# Patient Record
Sex: Female | Born: 1960 | Race: White | Hispanic: No | Marital: Married | Smoking: Former smoker
Health system: Southern US, Community
[De-identification: ages and names within clinical notes are randomized; demographics above are authoritative.]

## PROBLEM LIST (undated history)

## (undated) HISTORY — PX: TUBAL LIGATION: SHX77

---

## 1982-07-08 HISTORY — PX: OTHER SURGICAL HISTORY: SHX169

## 1999-03-15 ENCOUNTER — Encounter (INDEPENDENT_AMBULATORY_CARE_PROVIDER_SITE_OTHER): Payer: Self-pay | Admitting: Specialist

## 1999-03-15 ENCOUNTER — Other Ambulatory Visit: Admission: RE | Admit: 1999-03-15 | Discharge: 1999-03-15 | Payer: Self-pay | Admitting: Otolaryngology

## 2002-06-23 ENCOUNTER — Ambulatory Visit (HOSPITAL_COMMUNITY): Admission: RE | Admit: 2002-06-23 | Discharge: 2002-06-23 | Payer: Self-pay | Admitting: Family Medicine

## 2002-06-23 ENCOUNTER — Encounter: Payer: Self-pay | Admitting: Family Medicine

## 2002-07-05 ENCOUNTER — Ambulatory Visit (HOSPITAL_COMMUNITY): Admission: RE | Admit: 2002-07-05 | Discharge: 2002-07-05 | Payer: Self-pay | Admitting: Family Medicine

## 2002-07-05 ENCOUNTER — Encounter: Payer: Self-pay | Admitting: Family Medicine

## 2002-11-05 ENCOUNTER — Ambulatory Visit (HOSPITAL_BASED_OUTPATIENT_CLINIC_OR_DEPARTMENT_OTHER): Admission: RE | Admit: 2002-11-05 | Discharge: 2002-11-05 | Payer: Self-pay | Admitting: Otolaryngology

## 2002-11-05 ENCOUNTER — Encounter (INDEPENDENT_AMBULATORY_CARE_PROVIDER_SITE_OTHER): Payer: Self-pay | Admitting: Otolaryngology

## 2003-07-07 ENCOUNTER — Ambulatory Visit (HOSPITAL_COMMUNITY): Admission: RE | Admit: 2003-07-07 | Discharge: 2003-07-07 | Payer: Self-pay | Admitting: Family Medicine

## 2003-07-14 ENCOUNTER — Ambulatory Visit (HOSPITAL_COMMUNITY): Admission: RE | Admit: 2003-07-14 | Discharge: 2003-07-14 | Payer: Self-pay | Admitting: Family Medicine

## 2004-08-22 ENCOUNTER — Ambulatory Visit (HOSPITAL_COMMUNITY): Admission: RE | Admit: 2004-08-22 | Discharge: 2004-08-22 | Payer: Self-pay | Admitting: Family Medicine

## 2004-11-25 IMAGING — US US PELVIS COMPLETE MODIFY
1 series · 14 of 18 positions shown · non-contrast
Comparison: none

CLINICAL DATA: Pelvic pain, right lower quadrant pain.  
 US TRANSABDOMINAL/TRANSVAGINAL NON-OB PELVIC
 The uterus is retroverted.  There are no focal uterine abnormalities and the uterus measures 10.7 x 5.0 x 6.3 cm in size.  The endometrial stripe thickness measures 14 mm.  The ovaries are normal in size and contour and contain multiple simple appearing follicular cysts, with the largest cyst on the right measuring 1.4 cm in diameter and the largest cyst on the left measuring 1.7 cm in diameter.  There is a very small amount of free pelvic fluid.
 IMPRESSION
 1.  Retroverted uterus.
 2.  Multiple small most likely physiologic ovarian cysts noted bilaterally as discussed above.

[Series 1: unknown · 0.40mm/px · 14 of 18 slices shown]
[im 1/18]
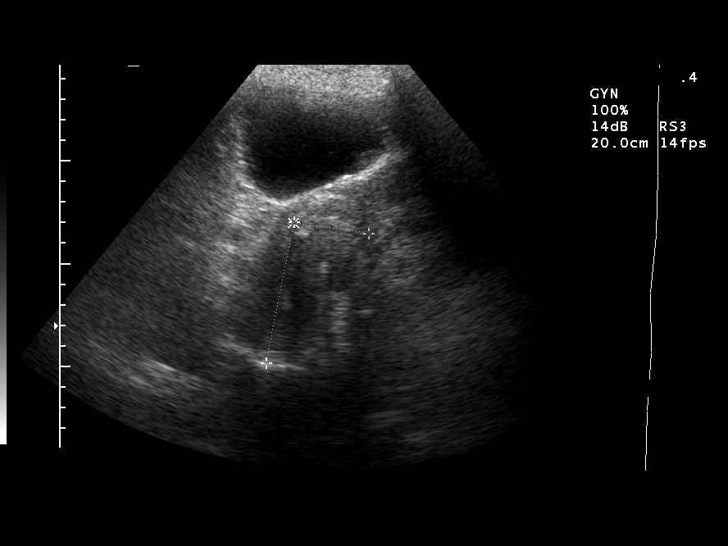
[im 2/18]
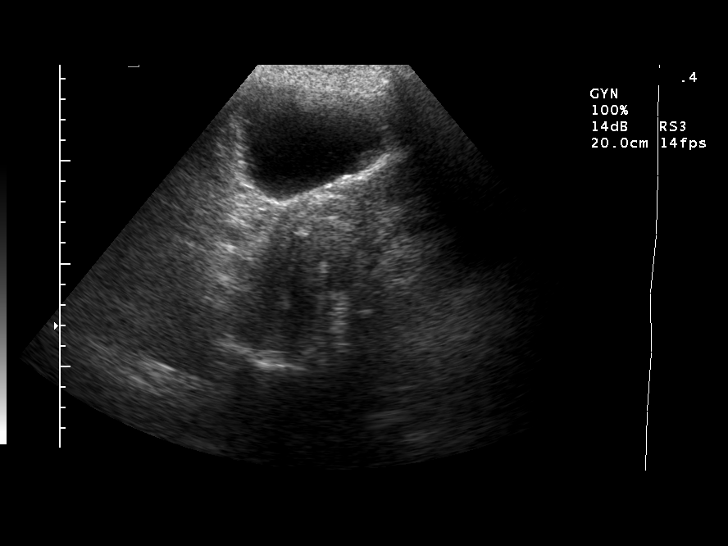
[im 4/18]
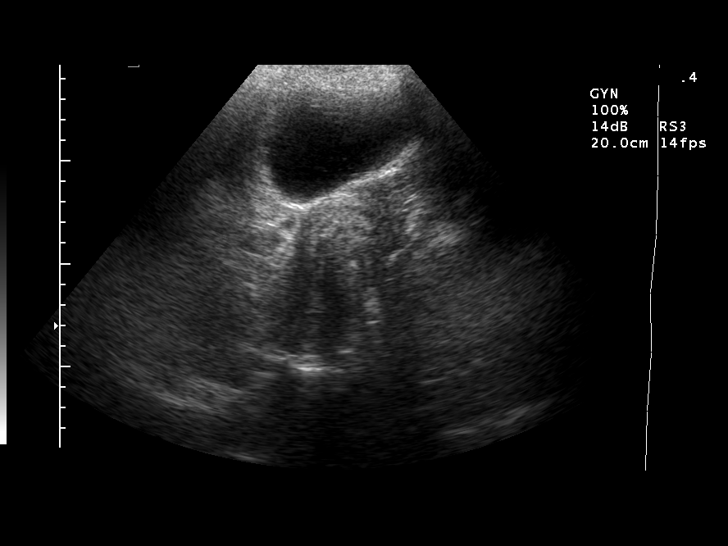
[im 5/18]
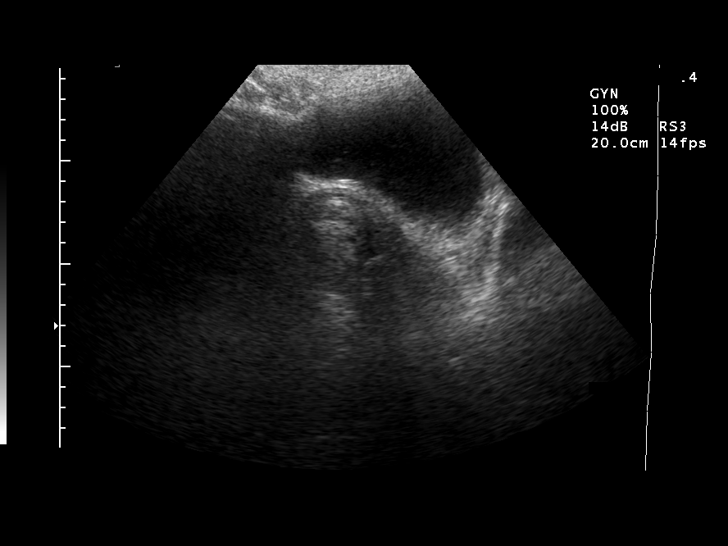
[im 6/18]
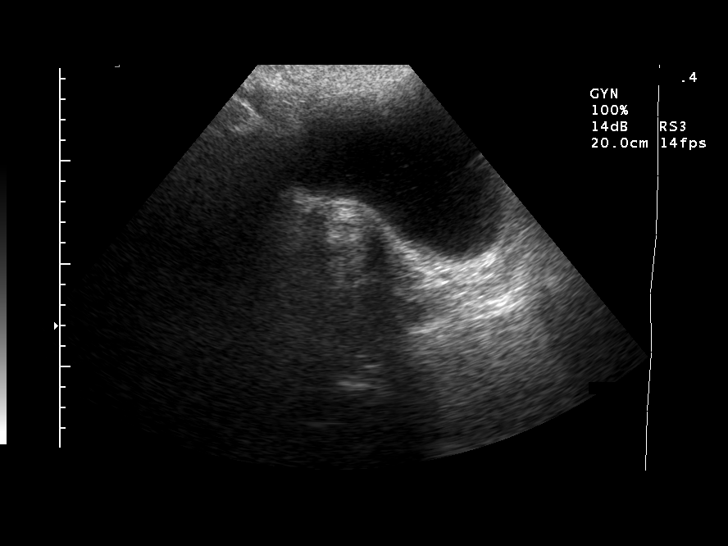
[im 8/18]
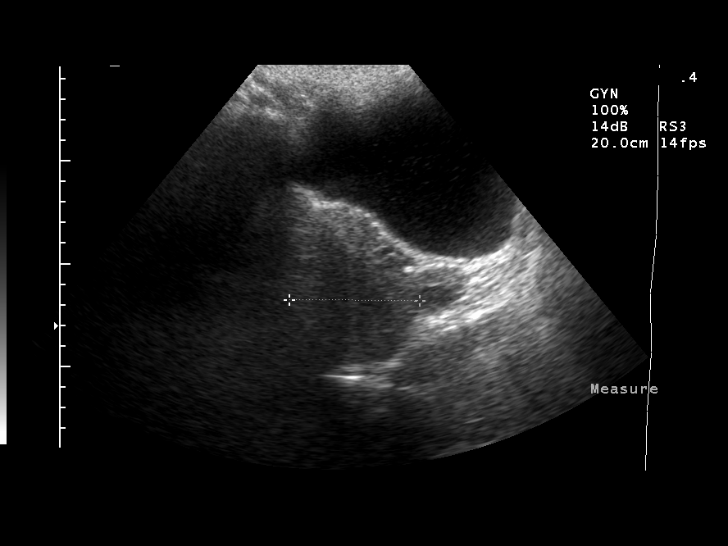
[im 9/18]
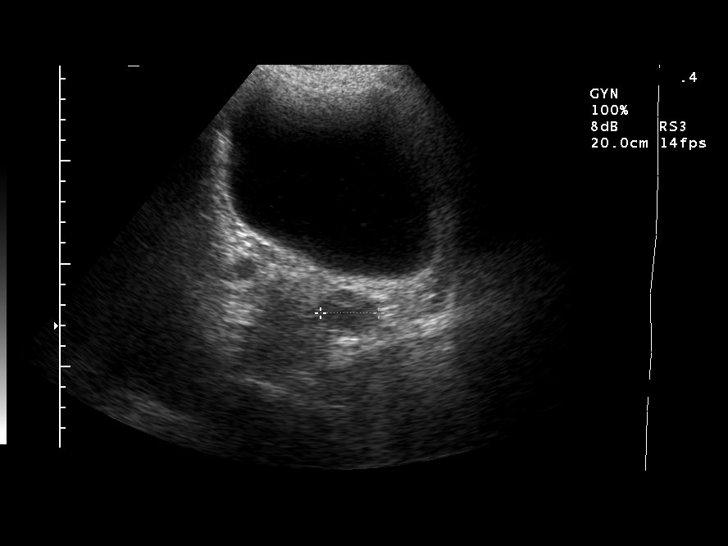
[im 10/18]
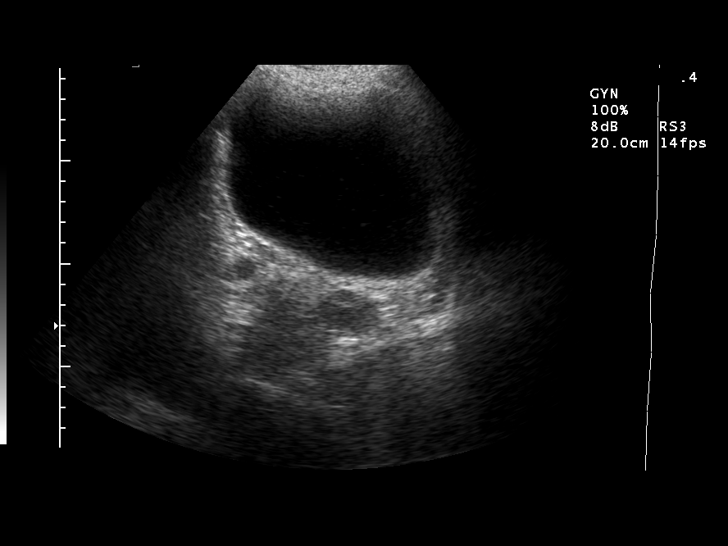
[im 11/18]
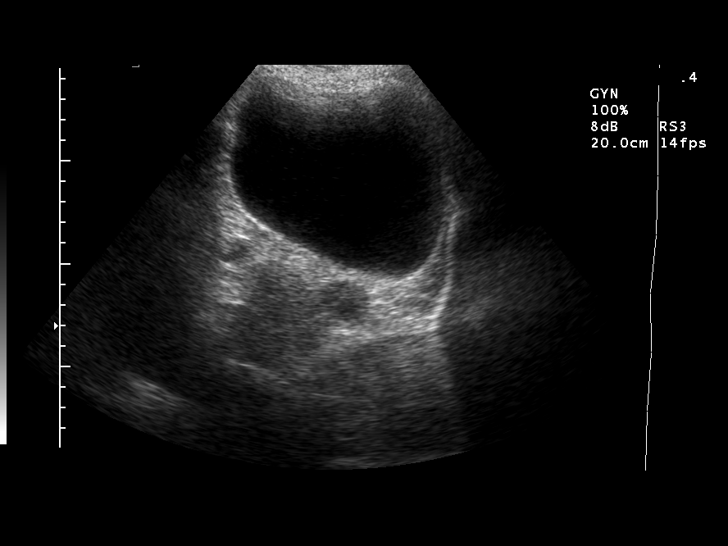
[im 13/18]
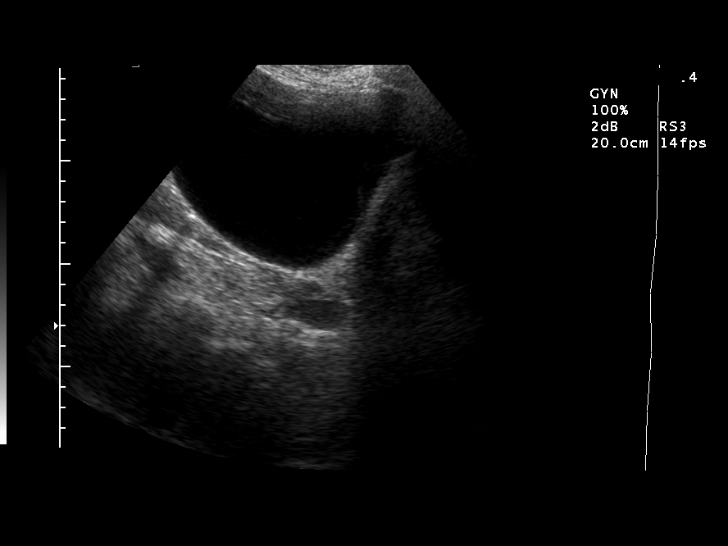
[im 14/18]
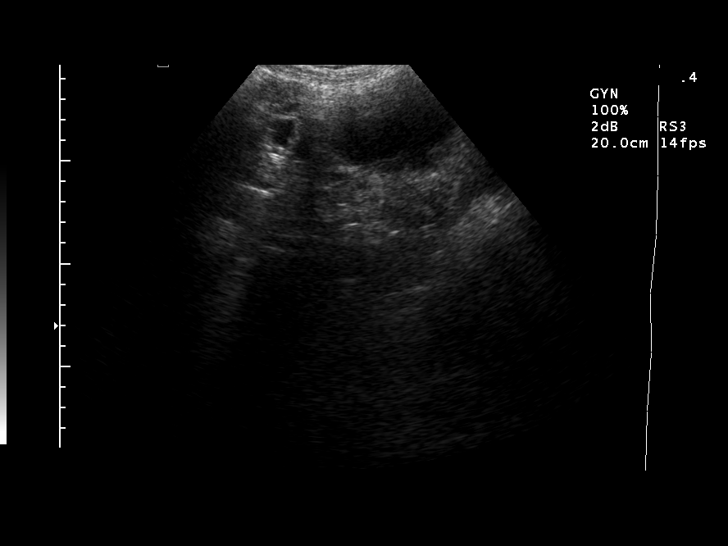
[im 15/18]
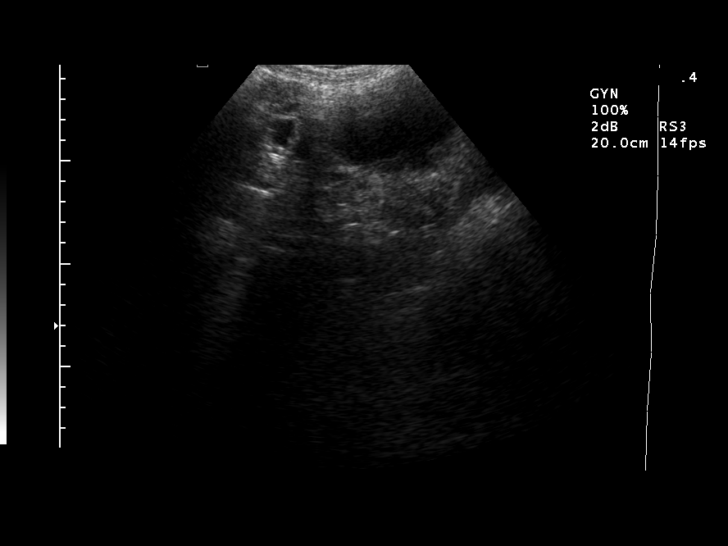
[im 17/18]
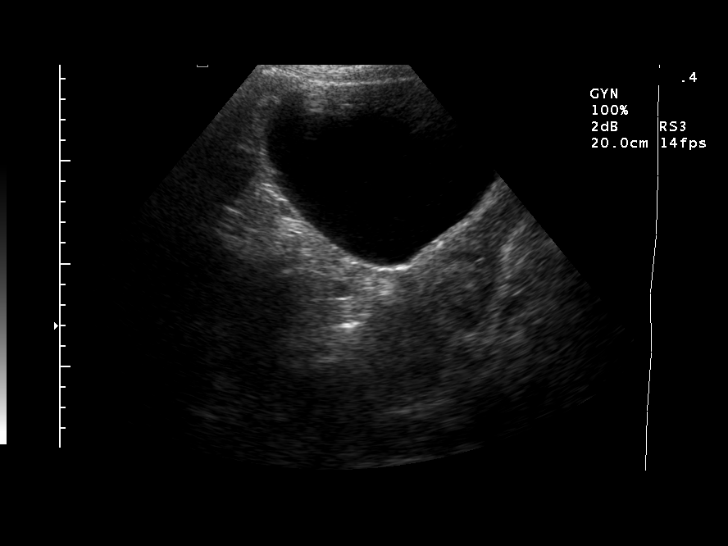
[im 18/18]
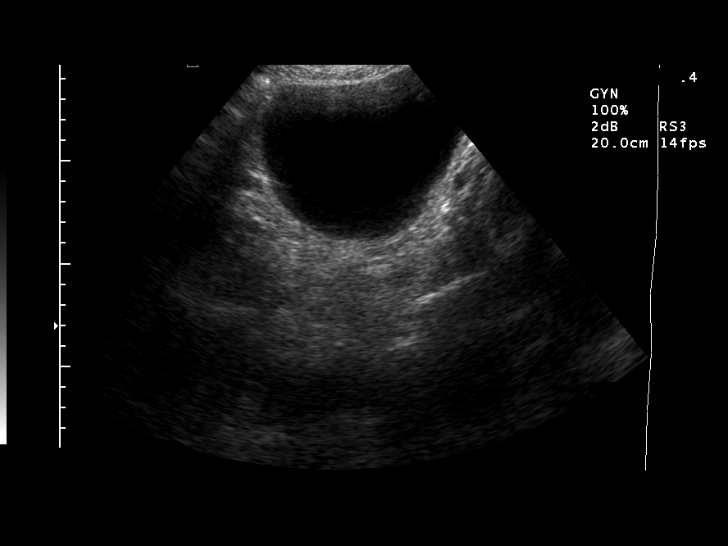

[14 of 18 positions shown; findings below may reference images not displayed]

## 2004-11-25 IMAGING — US US TRANSVAGINAL NON-OB
1 series · 14 of 25 positions shown · non-contrast
Comparison: none

CLINICAL DATA: Pelvic pain, right lower quadrant pain.  
 US TRANSABDOMINAL/TRANSVAGINAL NON-OB PELVIC
 The uterus is retroverted.  There are no focal uterine abnormalities and the uterus measures 10.7 x 5.0 x 6.3 cm in size.  The endometrial stripe thickness measures 14 mm.  The ovaries are normal in size and contour and contain multiple simple appearing follicular cysts, with the largest cyst on the right measuring 1.4 cm in diameter and the largest cyst on the left measuring 1.7 cm in diameter.  There is a very small amount of free pelvic fluid.
 IMPRESSION
 1.  Retroverted uterus.
 2.  Multiple small most likely physiologic ovarian cysts noted bilaterally as discussed above.

[Series 1: unknown · 0.15mm/px · 14 of 39 slices shown]
[im 1/39]
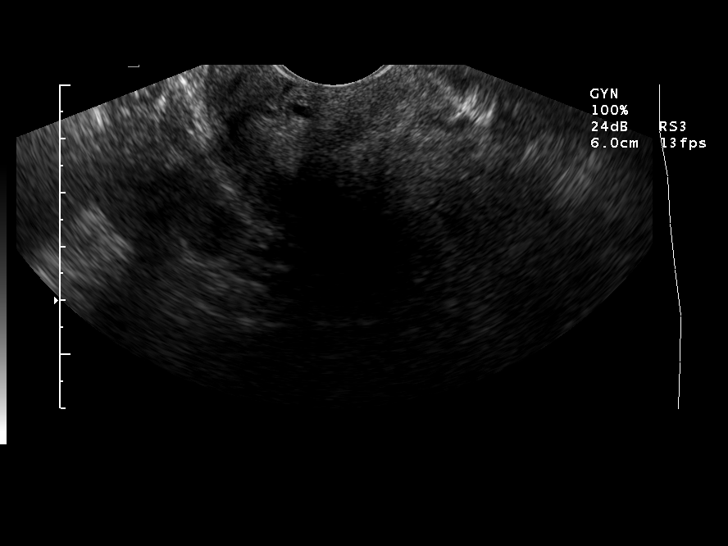
[im 4/39]
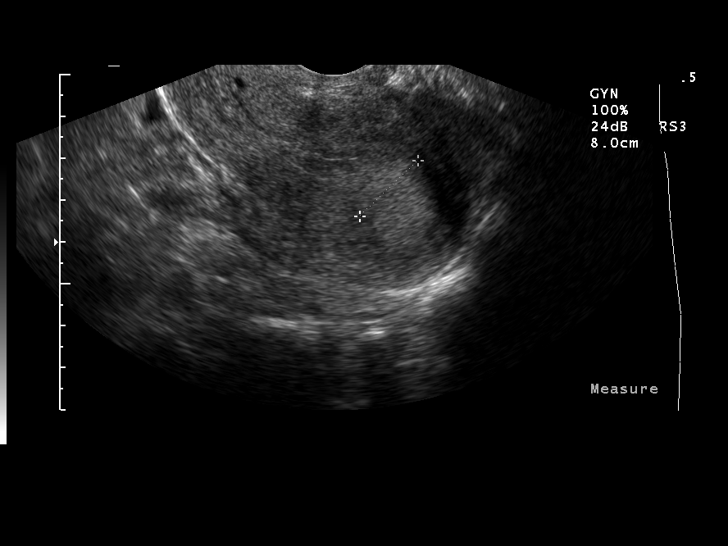
[im 7/39]
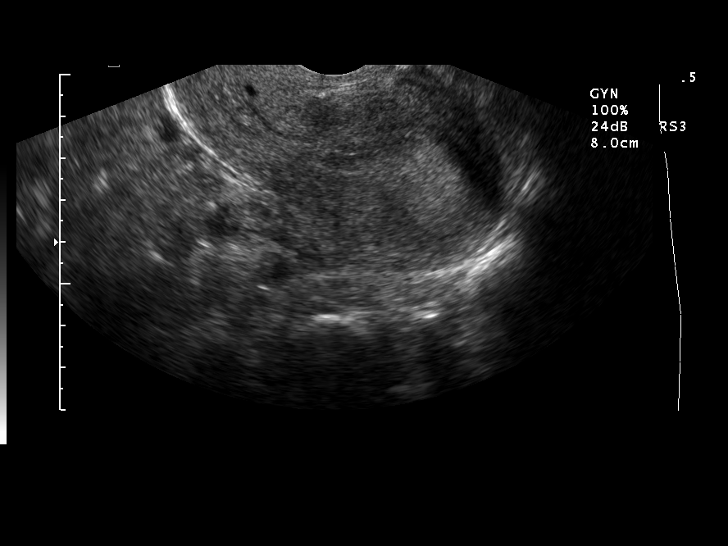
[im 10/39]
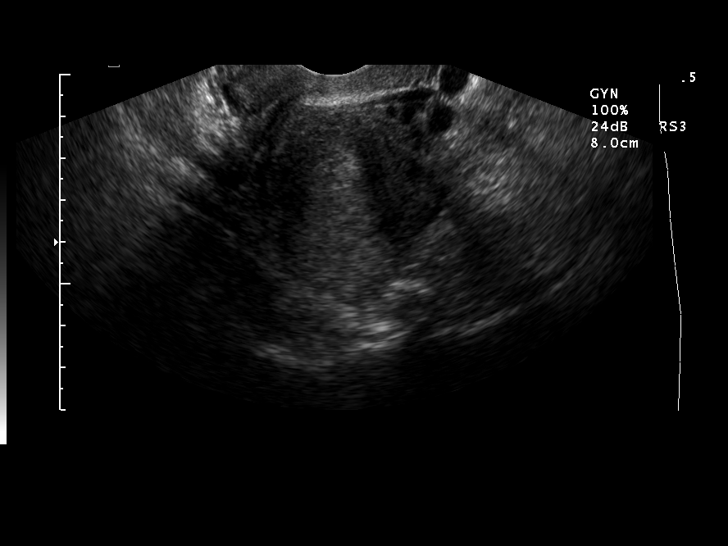
[im 13/39]
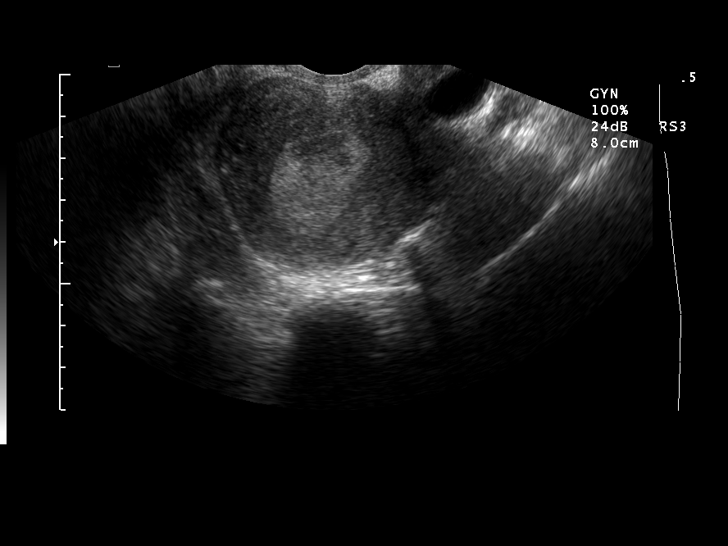
[im 15/39]
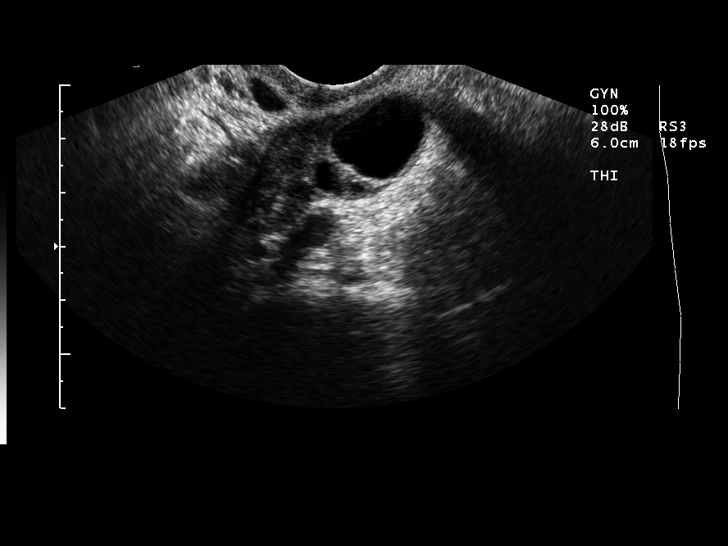
[im 18/39]
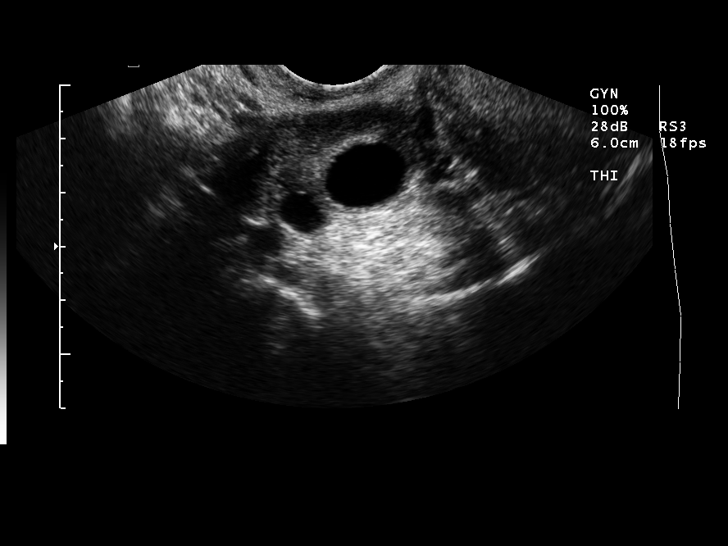
[im 21/39]
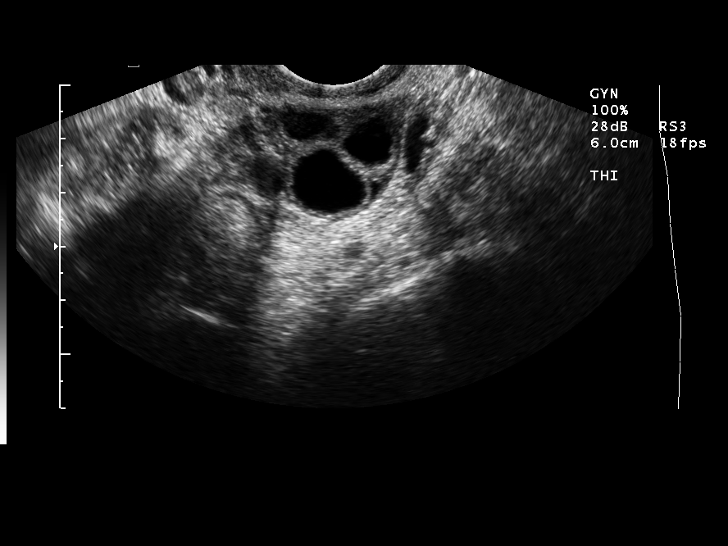
[im 24/39]
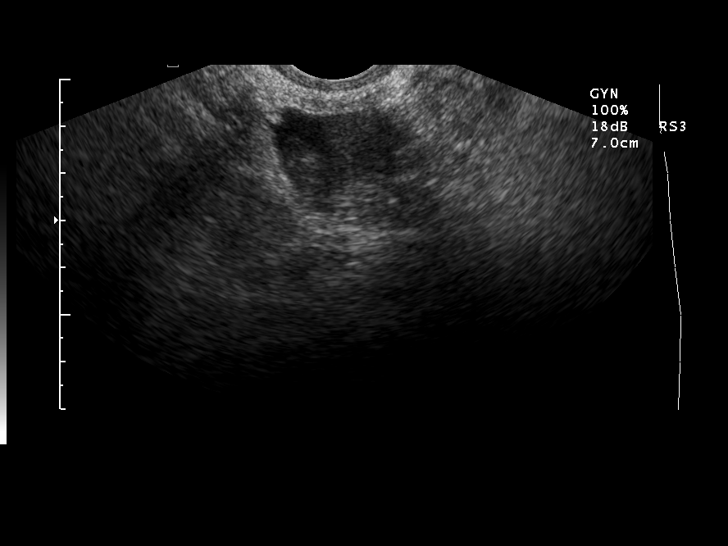
[im 26/39]
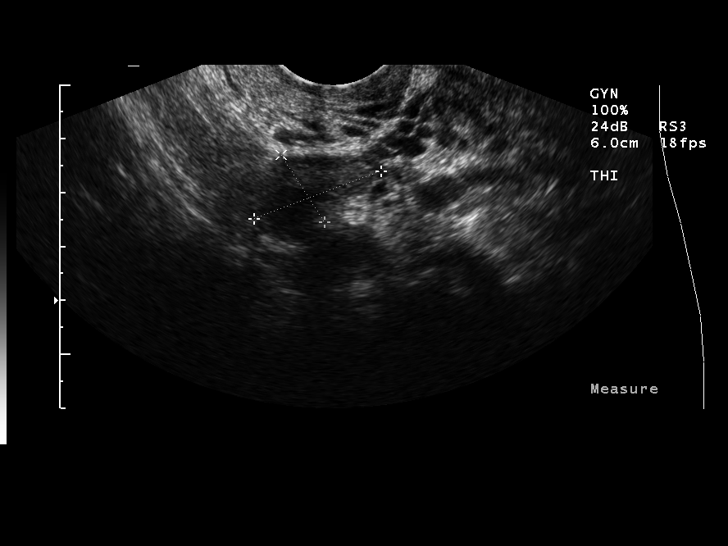
[im 29/39]
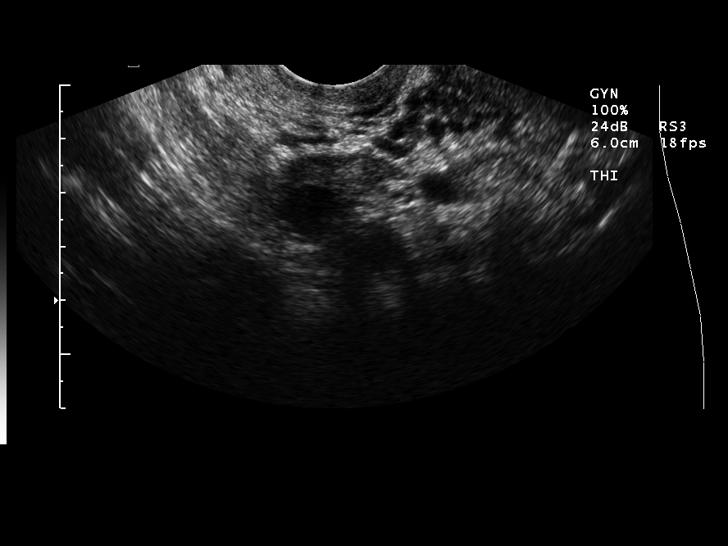
[im 32/39]
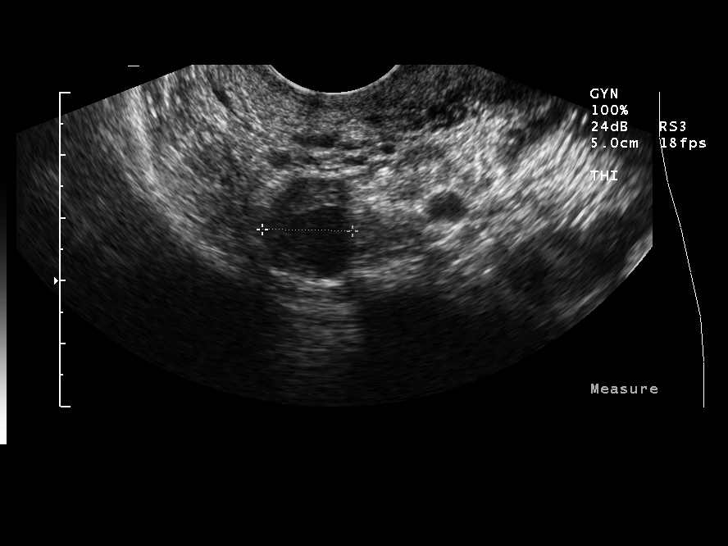
[im 35/39]
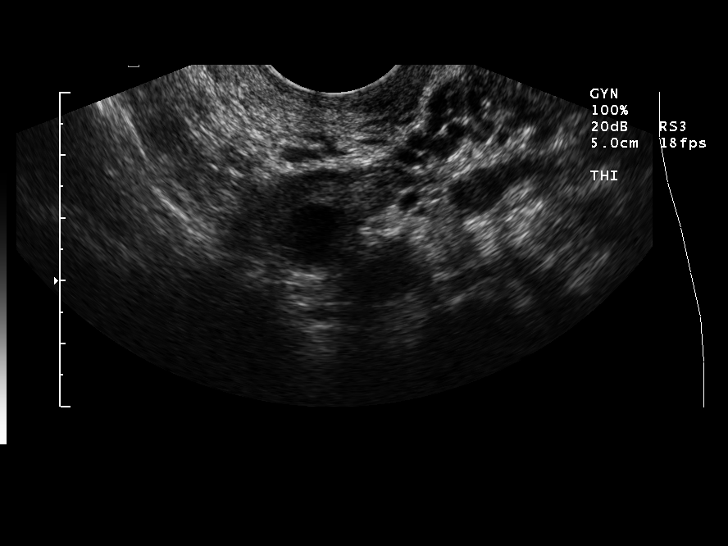
[im 39/39]
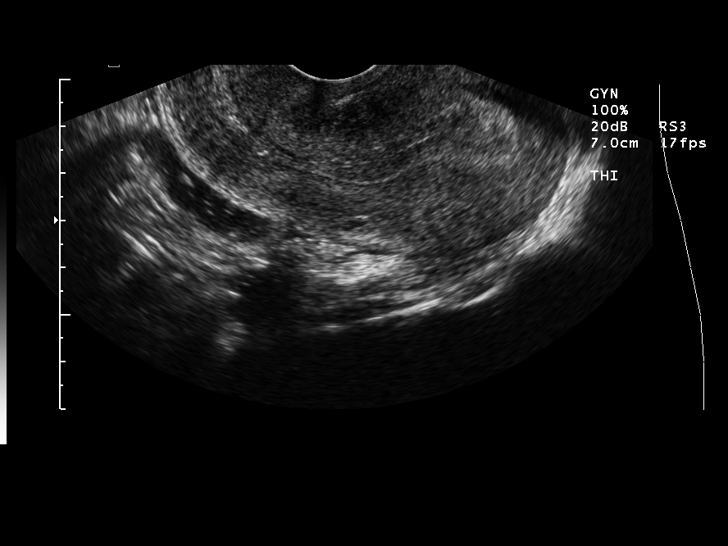

[14 of 25 positions shown; findings below may reference images not displayed]

## 2005-08-26 ENCOUNTER — Ambulatory Visit (HOSPITAL_COMMUNITY): Admission: RE | Admit: 2005-08-26 | Discharge: 2005-08-26 | Payer: Self-pay | Admitting: Family Medicine

## 2006-08-28 ENCOUNTER — Ambulatory Visit (HOSPITAL_COMMUNITY): Admission: RE | Admit: 2006-08-28 | Discharge: 2006-08-28 | Payer: Self-pay | Admitting: Family Medicine

## 2007-08-16 ENCOUNTER — Emergency Department (HOSPITAL_COMMUNITY): Admission: EM | Admit: 2007-08-16 | Discharge: 2007-08-16 | Payer: Self-pay | Admitting: Family Medicine

## 2007-09-22 ENCOUNTER — Ambulatory Visit (HOSPITAL_COMMUNITY): Admission: RE | Admit: 2007-09-22 | Discharge: 2007-09-22 | Payer: Self-pay | Admitting: Family Medicine

## 2008-09-23 ENCOUNTER — Ambulatory Visit (HOSPITAL_COMMUNITY): Admission: RE | Admit: 2008-09-23 | Discharge: 2008-09-23 | Payer: Self-pay | Admitting: Family Medicine

## 2009-10-05 ENCOUNTER — Ambulatory Visit (HOSPITAL_COMMUNITY): Admission: RE | Admit: 2009-10-05 | Discharge: 2009-10-05 | Payer: Self-pay | Admitting: Family Medicine

## 2009-11-14 ENCOUNTER — Encounter (INDEPENDENT_AMBULATORY_CARE_PROVIDER_SITE_OTHER): Payer: Self-pay | Admitting: *Deleted

## 2009-11-23 ENCOUNTER — Encounter (INDEPENDENT_AMBULATORY_CARE_PROVIDER_SITE_OTHER): Payer: Self-pay | Admitting: *Deleted

## 2009-11-27 ENCOUNTER — Ambulatory Visit: Payer: Self-pay | Admitting: Gastroenterology

## 2009-12-11 ENCOUNTER — Ambulatory Visit: Payer: Self-pay | Admitting: Gastroenterology

## 2010-08-07 NOTE — Miscellaneous (Signed)
Summary: [facility] Previsit/prep  Clinical Lists Changes  Medications: Added new medication of MOVIPREP 100 GM  SOLR (PEG-KCL-NACL-NASULF-NA ASC-C) As per prep instructions. - Signed Rx of MOVIPREP 100 GM  SOLR (PEG-KCL-NACL-NASULF-NA ASC-C) As per prep instructions.;  #1 x 0;  Signed;  Entered by: Wyona Almas RN;  Authorized by: Mardella Layman MD [facility];  Method used: Electronically to [facility]*, [address] [place], [place], [place]  [address], Ph: [phone] or [phone], Fax: [phone] Observations: Added new observation of NKA: T (11/27/2009 13:03)    Prescriptions: MOVIPREP 100 GM  SOLR (PEG-KCL-NACL-NASULF-NA ASC-C) As per prep instructions.  #1 x 0   Entered by:   Wyona Almas RN   Authorized by:   Mardella Layman MD [facility]   Signed by:   Wyona Almas RN on 11/27/2009   Method used:   Electronically to        [facility]* (retail)       [address]       [place], [place]  [address]       Ph: [phone] or [phone]       Fax: [phone]   RxID:   [id number]

## 2010-08-07 NOTE — Procedures (Signed)
Summary: Colonoscopy  Patient: Jacqueline Vargas Note: All result statuses are Final unless otherwise noted.  Tests: (1) Colonoscopy (COL)   COL Colonoscopy           DONE     [facility]     [address]     [place], [place]  [address]           COLONOSCOPY PROCEDURE REPORT           PATIENT:  [name], [name]  MR#:  [id number]     BIRTHDATE:  April 12, 1961, 48 yrs. old  GENDER:  female     ENDOSCOPIST:  Vania Rea. Jarold Motto, MD, [facility]     REF. BY:  Rudi Heap, M.D.     PROCEDURE DATE:  12/11/2009     PROCEDURE:  Surveillance Colonoscopy     ASA CLASS:  Class I     INDICATIONS:  colorectal cancer screening, average risk     MEDICATIONS:   Fentanyl 100 mcg IV, Versed 6 mg PO           DESCRIPTION OF PROCEDURE:   After the risks benefits and     alternatives of the procedure were thoroughly explained, informed     consent was obtained.  Digital rectal exam was performed and     revealed no abnormalities.   The LB CF-[id number] endoscope     was introduced through the anus and advanced to the cecum, which     was identified by both the appendix and ileocecal valve, limited     by a redundant colon.    The quality of the prep was excellent,     using MoviPrep.  The instrument was then slowly withdrawn as the     colon was fully examined.           FINDINGS:  No polyps or cancers were seen.  incomplete exam. VERY     REDUNDANT LEFT COLON.COULD NOT PASS SPLENIC FLEXURE AREA.     Retroflexed views in the rectum revealed not done.    The scope was     then withdrawn from the patient and the procedure completed.     COMPLICATIONS:  None     ENDOSCOPIC IMPRESSION:     1) No polyps or cancers     2) Incomplete exam     RECOMMENDATIONS:     REPEAT EXAM 5 Y WITH PROPOFOL SEDATION.     REPEAT EXAM:  No           ______________________________     Vania Rea. Jarold Motto, MD, [facility]           CC:           n.     eSIGNED:   Vania Rea. Alucard Fearnow at 12/11/2009 12:10 PM        [name], [name], [phone]  Note: An exclamation mark (!) indicates a result that was not dispersed into the flowsheet. Document Creation Date: 12/11/2009 12:11 PM _______________________________________________________________________  (1) Order result status: Final Collection or observation date-time: 12/11/2009 12:04 Requested date-time:  Receipt date-time:  Reported date-time:  Referring Physician:   Ordering Physician: Sheryn Bison [phone]) Specimen Source:  Source: Launa Grill Order Number: [id number] Lab site:   Appended Document: Colonoscopy     Procedures Next Due Date:    Colonoscopy: 12/2014

## 2010-08-07 NOTE — Letter (Signed)
Summary: [employer] Instructions  [facility]  [address] [place], [place] [address]   Phone: [phone]  Fax: [phone]       Jacqueline Vargas    March 01, 1961    MRN: [id number]        Procedure Day /Date:  12/11/09  Monday     Arrival Time: 10:30am      Procedure Time:  11:30am     Location of Procedure:                    _x _  [facility] (4th Floor)                        PREPARATION FOR COLONOSCOPY WITH MOVIPREP   Starting 5 days prior to your procedure _6/1/11 _ do not eat nuts, seeds, popcorn, corn, beans, peas,  salads, or any raw vegetables.  Do not take any fiber supplements (e.g. Metamucil, Citrucel, and Benefiber).  THE DAY BEFORE YOUR PROCEDURE         DATE:  12/10/09  DAY:   Sunday  1.  Drink clear liquids the entire day-NO SOLID FOOD  2.  Do not drink anything colored red or purple.  Avoid juices with pulp.  No orange juice.  3.  Drink at least 64 oz. (8 glasses) of fluid/clear liquids during the day to prevent dehydration and help the prep work efficiently.  CLEAR LIQUIDS INCLUDE: Water Jello Ice Popsicles Tea (sugar ok, no milk/cream) Powdered fruit flavored drinks Coffee (sugar ok, no milk/cream) Gatorade Juice: apple, white grape, white cranberry  Lemonade Clear bullion, consomm, broth Carbonated beverages (any kind) Strained chicken noodle soup Hard Candy                             4.  In the morning, mix first dose of MoviPrep solution:    Empty 1 Pouch A and 1 Pouch B into the disposable container    Add lukewarm drinking water to the top line of the container. Mix to dissolve    Refrigerate (mixed solution should be used within 24 hrs)  5.  Begin drinking the prep at 5:00 p.m. The MoviPrep container is divided by 4 marks.   Every 15 minutes drink the solution down to the next mark (approximately 8 oz) until the full liter is complete.   6.  Follow completed prep with 16 oz of clear liquid of your choice  (Nothing red or purple).  Continue to drink clear liquids until bedtime.  7.  Before going to bed, mix second dose of MoviPrep solution:    Empty 1 Pouch A and 1 Pouch B into the disposable container    Add lukewarm drinking water to the top line of the container. Mix to dissolve    Refrigerate  THE DAY OF YOUR PROCEDURE      DATE:  12/11/09  DAY:  Monday  Beginning at  6:30am  (5 hours before procedure):         1. Every 15 minutes, drink the solution down to the next mark (approx 8 oz) until the full liter is complete.  2. Follow completed prep with 16 oz. of clear liquid of your choice.    3. You may drink clear liquids until 9:30am  (2 HOURS BEFORE PROCEDURE).   MEDICATION INSTRUCTIONS  Unless otherwise instructed, you should take regular prescription medications with a small sip of  water   as early as possible the morning of your procedure.           OTHER INSTRUCTIONS  You will need a responsible adult at least 52 years of age to accompany you and drive you home.   This person must remain in the waiting room during your procedure.  Wear loose fitting clothing that is easily removed.  Leave jewelry and other valuables at home.  However, you may wish to bring a book to read or  an iPod/MP3 player to listen to music as you wait for your procedure to start.  Remove all body piercing jewelry and leave at home.  Total time from sign-in until discharge is approximately 2-3 hours.  You should go home directly after your procedure and rest.  You can resume normal activities the  day after your procedure.  The day of your procedure you should not:   Drive   Make legal decisions   Operate machinery   Drink alcohol   Return to work  You will receive specific instructions about eating, activities and medications before you leave.    The above instructions have been reviewed and explained to me by   Wyona Almas RN  Nov 27, 2009 1:54 PM     I fully  understand and can verbalize these instructions _____________________________ Date _________

## 2010-08-07 NOTE — Letter (Signed)
Summary: Previsit letter  [facility]  [address] [place], [place] [address]   Phone: [phone]  Fax: [phone]       11/14/2009 MRN: [id number]  Jacqueline Vargas [address] [place], [place]  [address]  Dear Ms. Steinberg,  Welcome to the Gastroenterology Division at [facility].    You are scheduled to see a nurse for your pre-procedure visit on 11-27-09 at 1:30pm on the 3rd floor at [facility], [address]  We ask that you try to arrive at our office 15 minutes prior to your appointment time to allow for check-in.  Your nurse visit will consist of discussing your medical and surgical history, your immediate family medical history, and your medications.    Please bring a complete list of all your medications or, if you prefer, bring the medication bottles and we will list them.  We will need to be aware of both prescribed and over the counter drugs.  We will need to know exact dosage information as well.  If you are on blood thinners (Coumadin, Plavix, Aggrenox, Ticlid, etc.) please call our office today/prior to your appointment, as we need to consult with your physician about holding your medication.   Please be prepared to read and sign documents such as consent forms, a financial agreement, and acknowledgement forms.  If necessary, and with your consent, a friend or relative is welcome to sit-in on the nurse visit with you.  Please bring your insurance card so that we may make a copy of it.  If your insurance requires a referral to see a specialist, please bring your referral form from your primary care physician.  No co-pay is required for this nurse visit.     If you cannot keep your appointment, please call [phone] to cancel or reschedule prior to your appointment date.  This allows [place] the opportunity to schedule an appointment for another patient in need of care.    Thank you for choosing [facility] for your medical  needs.  We appreciate the opportunity to care for you.  Please visit [place] at our website  to learn more about our practice.                     Sincerely.                                                                                                                   The Gastroenterology Division

## 2010-11-23 NOTE — Op Note (Signed)
   NAME:  Jacqueline Vargas, Jacqueline Vargas                      ACCOUNT NO.:  [id number]   MEDICAL RECORD NO.:  [id number]                   PATIENT TYPE:  AMB   LOCATION:  [facility]                                  FACILITY:  [facility]   PHYSICIAN:  Christopher E. Ezzard Standing, M.D.         DATE OF BIRTH:  [birth year]   DATE OF PROCEDURE:  11/05/2002  DATE OF DISCHARGE:                                 OPERATIVE REPORT   PREOPERATIVE DIAGNOSIS:  Left lateral tongue leukoplakia.   POSTOPERATIVE DIAGNOSIS:  Left lateral tongue leukoplakia.   OPERATION PERFORMED:  Excisional biopsy of left lateral tongue lesion.   SURGEON:  Kristine Garbe. Ezzard Standing, M.D.   ANESTHESIA:  Local 2% Xylocaine with a 1:100,000 epinephrine.   INDICATIONS FOR PROCEDURE:  The patient is a 50 year old female who has had  a white lesion on the left lateral tongue for several months now.  Has  slight central ulceration and she is referred for excisional biopsy.   DESCRIPTION OF PROCEDURE:  The tongue was injected with 2mL of 2% Xylocaine  with epinephrine.  The area of leukoplakia was elliptically excised and the  defect was closed with three interrupted 4-0 chromic sutures.  Specimen was  sent to pathology.   DISPOSITION:  The patient is discharged to home later this morning on  topical benzocaine and Tylenol  #3 p.r.n. pain.  Will call my office next  week to review pathology.                                               Kristine Garbe. Ezzard Standing, M.D.    CEN/MEDQ  D:  11/05/2002  T:  11/05/2002  Job:  [id number]

## 2011-07-09 LAB — HM PAP SMEAR: HM PAP: NORMAL

## 2011-07-18 ENCOUNTER — Other Ambulatory Visit: Payer: Self-pay | Admitting: Family Medicine

## 2011-07-18 DIAGNOSIS — Z139 Encounter for screening, unspecified: Secondary | ICD-10-CM

## 2011-07-23 ENCOUNTER — Ambulatory Visit (HOSPITAL_COMMUNITY)
Admission: RE | Admit: 2011-07-23 | Discharge: 2011-07-23 | Disposition: A | Discharge status: Home / Self Care | Payer: BC Managed Care – PPO | Source: Ambulatory Visit | Attending: Family Medicine | Admitting: Family Medicine

## 2011-07-23 DIAGNOSIS — Z1231 Encounter for screening mammogram for malignant neoplasm of breast: Secondary | ICD-10-CM | POA: Insufficient documentation

## 2011-07-23 DIAGNOSIS — Z139 Encounter for screening, unspecified: Secondary | ICD-10-CM

## 2011-08-23 ENCOUNTER — Other Ambulatory Visit: Payer: Self-pay | Admitting: Family Medicine

## 2011-08-23 DIAGNOSIS — R1900 Intra-abdominal and pelvic swelling, mass and lump, unspecified site: Secondary | ICD-10-CM

## 2011-08-28 ENCOUNTER — Ambulatory Visit (HOSPITAL_COMMUNITY)
Admission: RE | Admit: 2011-08-28 | Discharge: 2011-08-28 | Disposition: A | Discharge status: Home / Self Care | Payer: BC Managed Care – PPO | Source: Ambulatory Visit | Attending: Family Medicine | Admitting: Family Medicine

## 2011-08-28 ENCOUNTER — Other Ambulatory Visit: Payer: Self-pay | Admitting: Family Medicine

## 2011-08-28 DIAGNOSIS — R1900 Intra-abdominal and pelvic swelling, mass and lump, unspecified site: Secondary | ICD-10-CM

## 2014-11-02 ENCOUNTER — Telehealth: Payer: Self-pay | Admitting: *Deleted

## 2014-11-02 NOTE — Telephone Encounter (Signed)
Dr Rhea BeltonPyrtle has reviewed patient's colonoscopy recall note from 12/11/09. Patient has a very redundant left colon (could not pass splenic flexure). Dr Rhea BeltonPyrtle feels that patient should have a cologuard prior to trying to schedule for any colonoscopy procedure. I have called the home number listed for patient and am told patient is at work. I have asked that a message be given to patient to call our office back. The female wanted to know what the call was in regards to. States he is patient's husband. I explained that unfortunately I do not have a release on file to talk to him. He states, "well, Im not going to tell her then. You will just have to call back when she's at home." I will attempt to contact patient herself at a later time.

## 2014-11-08 NOTE — Telephone Encounter (Signed)
Attempted to reach patient. No voicemail and no answer.

## 2014-11-09 NOTE — Telephone Encounter (Signed)
I have spoken with patient to advise of Dr Lauro FranklinPyrtle's recommendations below. She verbalizes understanding that we feel it would be better for her to have a cologuard prior to attempting colonoscopy again due to redundant colon. She agrees to complete cologuard. I have sent orders to [facility] for this and I explained that we will be back in touch with her once we have gotten results.

## 2014-11-21 LAB — COLOGUARD: COLOGUARD: NEGATIVE

## 2014-12-06 ENCOUNTER — Telehealth: Payer: Self-pay

## 2014-12-06 NOTE — Telephone Encounter (Signed)
Pts Cologuard result negative. Called pt to give results and left message for pt to call back. Recall entered for 3 years.

## 2014-12-07 NOTE — Telephone Encounter (Signed)
Pt aware.

## 2014-12-14 ENCOUNTER — Encounter: Payer: Self-pay | Admitting: Internal Medicine

## 2015-07-12 ENCOUNTER — Encounter: Payer: Self-pay | Admitting: Family Medicine

## 2015-07-14 ENCOUNTER — Encounter: Payer: Self-pay | Admitting: Family Medicine

## 2015-07-14 ENCOUNTER — Ambulatory Visit (INDEPENDENT_AMBULATORY_CARE_PROVIDER_SITE_OTHER): Payer: BLUE CROSS/BLUE SHIELD | Admitting: Family Medicine

## 2015-07-14 VITALS — BP 110/68 | HR 98 | Temp 98.5°F | Resp 16 | Ht 65.0 in | Wt 127.0 lb

## 2015-07-14 DIAGNOSIS — J019 Acute sinusitis, unspecified: Secondary | ICD-10-CM

## 2015-07-14 DIAGNOSIS — T783XXA Angioneurotic edema, initial encounter: Secondary | ICD-10-CM | POA: Diagnosis not present

## 2015-07-14 MED ORDER — PREDNISONE 20 MG PO TABS: ORAL_TABLET | ORAL | Status: DC

## 2015-07-14 MED ORDER — AMOXICILLIN 875 MG PO TABS: 875.0000 mg | ORAL_TABLET | Freq: Two times a day (BID) | ORAL | Status: DC

## 2015-07-14 NOTE — Progress Notes (Signed)
   Subjective:    Patient ID: Jacqueline HamPatricia J Vargas, female    DOB: [birth year], 55 y.o.   MRN: [id number]  HPI  symptoms began 1 week ago. She reports pain in her right frontal and right maxillary sinus. She reports fevers and postnasal drip and rhinorrhea. Tuesday she had an episode of angioedema. Her top lip began to tingle. Then swelled noticeably. She denied any swelling in her tongue. She denies any stridor or shortness of breath or wheezing or cough. Unfortunately she cannot determine what she is allergic to. She denies any  Change in her diet. She denies any new perfume or moisturizer or lipstick. She denies any new medication. Symptoms occurred without provocation. No past medical history on file. Past Surgical History  Procedure Laterality Date  . Tubal ligation    . Abnormal pap  1984   No current outpatient prescriptions on file prior to visit.   No current facility-administered medications on file prior to visit.   No Known Allergies Social History   Social History  . Marital Status: Married    Spouse Name: N/A  . Number of Children: N/A  . Years of Education: N/A   Occupational History  . Not on file.   Social History Main Topics  . Smoking status: Former Smoker    Types: Cigarettes    Quit date: 02/05/2005  . Smokeless tobacco: Not on file  . Alcohol Use: Yes  . Drug Use: No  . Sexual Activity: Yes    Birth Control/ Protection: None   Other Topics Concern  . Not on file   Social History Narrative      Review of Systems  All other systems reviewed and are negative.      Objective:   Physical Exam  HENT:  Right Ear: Tympanic membrane, external ear and ear canal normal.  Left Ear: Tympanic membrane, external ear and ear canal normal.  Nose: Mucosal edema and rhinorrhea present. Right sinus exhibits maxillary sinus tenderness and frontal sinus tenderness.  Mouth/Throat: Oropharynx is clear and moist. No uvula swelling. No oropharyngeal exudate.  Eyes:  Conjunctivae are normal. Pupils are equal, round, and reactive to light.  Neck: Neck supple.  Cardiovascular: Normal rate, regular rhythm and normal heart sounds.   No murmur heard. Pulmonary/Chest: Effort normal and breath sounds normal. No stridor. No respiratory distress. She has no wheezes. She has no rales.  Lymphadenopathy:    She has no cervical adenopathy.  Vitals reviewed.    Mild swelling in the right upper lip      Assessment & Plan:  Acute rhinosinusitis - Plan: amoxicillin (AMOXIL) 875 MG tablet, predniSONE (DELTASONE) 20 MG tablet  Angioedema, initial encounter   I will treat her sinus infection with amoxicillin 875 mg by mouth twice a day for 10 days and a prednisone taper pack. I'm also using a prednisone taper pack to help with the angioedema. I've also recommended the patient begin Claritin 10 mg by mouth daily. Recheck immediately if angioedema does not improve. If angioedema occurs again, I would recommend a referral to an allergist for allergy testing

## 2016-08-12 ENCOUNTER — Telehealth: Payer: Self-pay | Admitting: Family Medicine

## 2016-08-12 ENCOUNTER — Other Ambulatory Visit: Payer: Self-pay | Admitting: Family Medicine

## 2016-08-12 MED ORDER — OSELTAMIVIR PHOSPHATE 75 MG PO CAPS: 75.0000 mg | ORAL_CAPSULE | Freq: Every day | ORAL | 0 refills | Status: DC

## 2016-08-12 NOTE — Telephone Encounter (Signed)
Exposure to flu - would like tamiflu called into pharm if possible? - OK to do?

## 2016-08-12 NOTE — Telephone Encounter (Signed)
Medication called/sent to requested pharmacy and pt aware 

## 2016-08-12 NOTE — Telephone Encounter (Signed)
tamiflu 75 mg poqday for 10 days 

## 2017-11-21 ENCOUNTER — Encounter: Payer: Self-pay | Admitting: Internal Medicine

## 2017-12-31 ENCOUNTER — Encounter: Payer: Self-pay | Admitting: Internal Medicine

## 2018-01-19 ENCOUNTER — Telehealth: Payer: Self-pay | Admitting: Internal Medicine

## 2018-01-19 NOTE — Telephone Encounter (Signed)
Pt is due for cologuard recall but states her insurance company will not pay for it this time. Pt requesting to schedule a colonoscopy. Can pt be a direct or does she need an OV first. Please advise.

## 2018-01-19 NOTE — Telephone Encounter (Signed)
Jacqueline Vargas patient Not established with me.  Sounds like history of tortuous colon and incomplete colonoscopy in 2011.  Cologuard was reportedly done previously and not covered this time. Given history of incomplete colonoscopy, I think best to have her come to the office with the next available MD (or APP if okay with patient) to discuss options. Repeat colonoscopy make ultimately be offered, but patient needs to be advised that is again incomplete, then another screening option (such as barium enema or CT colonoscopy) would be recommended.  This is the discussion she can have with the MD she will see. Thanks

## 2018-01-19 NOTE — Telephone Encounter (Signed)
Pt received cologuard test but her insurance does not cover it. She wants to know how she can return the kit and would like to schedule colon. Pls call her.

## 2018-01-19 NOTE — Telephone Encounter (Signed)
Patient needs to be scheduled with an APP or any physician to discuss colon. Pt was established with Dr. Jarold MottoPatterson, has never seen Dr. Rhea BeltonPyrtle. Please schedule pt for appt.

## 2018-01-28 NOTE — Telephone Encounter (Signed)
Left message to call back to schedule ov.   ° ° °

## 2018-03-13 ENCOUNTER — Encounter: Payer: Self-pay | Admitting: Internal Medicine

## 2019-04-16 ENCOUNTER — Other Ambulatory Visit: Payer: Self-pay

## 2019-04-16 DIAGNOSIS — Z20822 Contact with and (suspected) exposure to covid-19: Secondary | ICD-10-CM

## 2019-04-18 LAB — NOVEL CORONAVIRUS, NAA: SARS-CoV-2, NAA: NOT DETECTED

## 2019-07-19 ENCOUNTER — Other Ambulatory Visit: Payer: Self-pay

## 2019-07-19 ENCOUNTER — Ambulatory Visit (INDEPENDENT_AMBULATORY_CARE_PROVIDER_SITE_OTHER): Payer: BC Managed Care – PPO | Admitting: Family Medicine

## 2019-07-19 ENCOUNTER — Encounter: Payer: Self-pay | Admitting: Family Medicine

## 2019-07-19 VITALS — BP 136/78 | HR 77 | Temp 96.9°F | Resp 14 | Ht 65.0 in | Wt 139.0 lb

## 2019-07-19 DIAGNOSIS — M79672 Pain in left foot: Secondary | ICD-10-CM | POA: Diagnosis not present

## 2019-07-19 DIAGNOSIS — G8929 Other chronic pain: Secondary | ICD-10-CM | POA: Diagnosis not present

## 2019-07-19 NOTE — Progress Notes (Signed)
Subjective:    Patient ID: Jacqueline Vargas, female    DOB: [birth year], 60 y.o.   MRN: [id number]  HPI Patient self,.  She works as a Interior and spatial designer.  She is on her feet for more than 8 hours every day.  Over the last year she has developed progressive pain in her left heel on the plantar aspect of the calcaneus near the insertion of the plantar fascia.  The pain is constant and severe.  Hurts with prolonged standing or walking.  It also hurts as soon as she gets out of bed in the morning.  Today she is limping try to keep her heel off the floor. No past medical history on file. Past Surgical History:  Procedure Laterality Date  . abnormal pap  1984  . TUBAL LIGATION     No current outpatient medications on file prior to visit.   No current facility-administered medications on file prior to visit.    No Known Allergies Social History   Socioeconomic History  . Marital status: Married    Spouse name: Not on file  . Number of children: Not on file  . Years of education: Not on file  . Highest education level: Not on file  Occupational History  . Not on file  Tobacco Use  . Smoking status: Former Smoker    Types: Cigarettes    Quit date: 02/05/2005    Years since quitting: 14.4  Substance and Sexual Activity  . Alcohol use: Yes  . Drug use: No  . Sexual activity: Yes    Birth control/protection: None  Other Topics Concern  . Not on file  Social History Narrative  . Not on file   Social Determinants of Health   Financial Resource Strain:   . Difficulty of Paying Living Expenses: Not on file  Food Insecurity:   . Worried About Programme researcher, broadcasting/film/video in the Last Year: Not on file  . Ran Out of Food in the Last Year: Not on file  Transportation Needs:   . Lack of Transportation (Medical): Not on file  . Lack of Transportation (Non-Medical): Not on file  Physical Activity:   . Days of Exercise per Week: Not on file  . Minutes of Exercise per Session: Not on file  Stress:     . Feeling of Stress : Not on file  Social Connections:   . Frequency of Communication with Friends and Family: Not on file  . Frequency of Social Gatherings with Friends and Family: Not on file  . Attends Religious Services: Not on file  . Active Member of Clubs or Organizations: Not on file  . Attends Banker Meetings: Not on file  . Marital Status: Not on file  Intimate Partner Violence:   . Fear of Current or Ex-Partner: Not on file  . Emotionally Abused: Not on file  . Physically Abused: Not on file  . Sexually Abused: Not on file      Review of Systems  All other systems reviewed and are negative.      Objective:   Physical Exam  HENT:  Right Ear: Tympanic membrane and ear canal normal.  Left Ear: Tympanic membrane and ear canal normal.  Nose: Mucosal edema and rhinorrhea present. Right sinus exhibits maxillary sinus tenderness and frontal sinus tenderness.  Mouth/Throat: No uvula swelling.  Eyes: Pupils are equal, round, and reactive to light.  Cardiovascular: Normal rate, regular rhythm and normal heart sounds.  No murmur heard. Pulmonary/Chest: Effort normal  and breath sounds normal. No respiratory distress. She has no wheezes. She has no rales.  Musculoskeletal:     Cervical back: Neck supple.     Left foot: Normal capillary refill. Tenderness and bony tenderness present. No swelling, deformity or crepitus.       Feet:  Vitals reviewed.        Assessment & Plan:  Chronic heel pain, left  I suspect plantar fasciitis versus a heel spur versus a combination.  Using sterile technique, I injected the point of maximum tenderness with a mixture of 1 cc of 0.1% lidocaine without epinephrine and 1 cc of 40 mg/mL Kenalog.  The patient tolerated the procedure well without complication.  If pain does not improve, next that would be obtain x-rays of the left heel to rule out underlying skeletal abnormalities and also consult podiatry.

## 2019-08-03 ENCOUNTER — Other Ambulatory Visit: Payer: Self-pay

## 2019-08-03 ENCOUNTER — Ambulatory Visit: Payer: BC Managed Care – PPO | Attending: Internal Medicine

## 2019-08-03 DIAGNOSIS — Z20822 Contact with and (suspected) exposure to covid-19: Secondary | ICD-10-CM

## 2019-08-04 LAB — NOVEL CORONAVIRUS, NAA: SARS-CoV-2, NAA: DETECTED — AB

## 2019-08-04 NOTE — Progress Notes (Signed)
Your test for COVID-19 was positive ("detected"), meaning that you were infected with the novel coronavirus and could give the germ to others.    Please continue isolation at home, for at least 10 days since the start of your fever/cough/breathlessness and until you have had 24 hours without fever (without taking a fever reducer) and with any cough/breathlessness improving. Use over-the-counter medications for symptoms.  If you have had no symptoms, but were exposed to someone who was positive for COVID-19, you will need to quarantine and self-isolate for 14 days from the date of exposure.    Please continue good preventive care measures, including:  frequent hand-washing, avoid touching your face, cover coughs/sneezes, stay out of crowds and keep a 6 foot distance from others.  Clean hard surfaces touched frequently with disinfectant cleaning products.   Please check in with your primary care provider about your positive test result.  Go to the nearest urgent care or ED for assessment if you have severe breathlessness or severe weakness/fatigue (ex needing new help getting out of bed or to the bathroom).  Members of your household will also need to quarantine for 14 days from the date of your positive test.You may also be contacted by the health department for follow up. Please call [facility] at [phone] if you have any questions or concerns.     

## 2019-08-05 ENCOUNTER — Telehealth: Payer: Self-pay | Admitting: Internal Medicine

## 2019-08-05 ENCOUNTER — Ambulatory Visit (INDEPENDENT_AMBULATORY_CARE_PROVIDER_SITE_OTHER): Payer: BC Managed Care – PPO | Admitting: Family Medicine

## 2019-08-05 ENCOUNTER — Other Ambulatory Visit: Payer: Self-pay

## 2019-08-05 DIAGNOSIS — U071 COVID-19: Secondary | ICD-10-CM

## 2019-08-05 DIAGNOSIS — M722 Plantar fascial fibromatosis: Secondary | ICD-10-CM | POA: Diagnosis not present

## 2019-08-05 NOTE — Telephone Encounter (Signed)
Called to discuss with Maxcine Ham about Covid symptoms and the use of bamlanivimab, a monoclonal antibody infusion for those with mild to moderate Covid symptoms and at a high risk of hospitalization.    Pt is not qualified for this infusion due to lack of identified risk factors and co-morbid conditions.  Symptoms reviewed as well as criteria for ending isolation.  Symptoms reviewed that would warrant ED/Hospital evaluation as well should her condition worsen. Preventative practices reviewed. Patient verbalized understanding. Number to clinic given as father in law and husband are in house and awaiting testing.    There are no problems to display for this patient.   Cyndee Brightly, NP-C [facility]  pgr [phone]

## 2019-08-05 NOTE — Progress Notes (Signed)
Subjective:    Patient ID: Jacqueline Vargas, female    DOB: December 08, 1960, 59 y.o.   MRN: [id number]  HPI  Patient is being seen today via telephone.  Phone call began at 1205.  Phone call concluded at 1215.  Patient developed severe stomach cramps and abdominal discomfort on Saturday.  The stomach cramps and abdominal discomfort persisted through Sunday.  By Monday she was developing fatigue and weakness in her extremities.  Tuesday she went for a Covid test.  She just learned this morning that her Covid test is positive.  However her stomach cramps have subsided.  She continues to report fatigue however the strength in her legs is better today.  She denies any cough or fever or chills or shortness of breath or chest pain.  At her last visit I performed a cortisone injection in her heel due to plantar fasciitis.  Although this helped some the pain is still present and she would like to see a podiatrist for this. No past medical history on file. Past Surgical History:  Procedure Laterality Date  . abnormal pap  1984  . TUBAL LIGATION     No current outpatient medications on file prior to visit.   No current facility-administered medications on file prior to visit.   No Known Allergies Social History   Socioeconomic History  . Marital status: Married    Spouse name: Not on file  . Number of children: Not on file  . Years of education: Not on file  . Highest education level: Not on file  Occupational History  . Not on file  Tobacco Use  . Smoking status: Former Smoker    Types: Cigarettes    Quit date: 02/05/2005    Years since quitting: 14.5  . Smokeless tobacco: Current User  Substance and Sexual Activity  . Alcohol use: Yes  . Drug use: No  . Sexual activity: Yes    Birth control/protection: None  Other Topics Concern  . Not on file  Social History Narrative  . Not on file   Social Determinants of Health   Financial Resource Strain:   . Difficulty of Paying Living  Expenses: Not on file  Food Insecurity:   . Worried About Charity fundraiser in the Last Year: Not on file  . Ran Out of Food in the Last Year: Not on file  Transportation Needs:   . Lack of Transportation (Medical): Not on file  . Lack of Transportation (Non-Medical): Not on file  Physical Activity:   . Days of Exercise per Week: Not on file  . Minutes of Exercise per Session: Not on file  Stress:   . Feeling of Stress : Not on file  Social Connections:   . Frequency of Communication with Friends and Family: Not on file  . Frequency of Social Gatherings with Friends and Family: Not on file  . Attends Religious Services: Not on file  . Active Member of Clubs or Organizations: Not on file  . Attends Archivist Meetings: Not on file  . Marital Status: Not on file  Intimate Partner Violence:   . Fear of Current or Ex-Partner: Not on file  . Emotionally Abused: Not on file  . Physically Abused: Not on file  . Sexually Abused: Not on file     Review of Systems  All other systems reviewed and are negative.      Objective:   Physical Exam        Assessment &  Plan:  COVID-19 virus infection  Patient is to be quarantined until February 5.  At the present time she seems like she is improving.  Recommended tincture of time.  I did also offer the patient vitamin C, vitamin D, and zinc to help ameliorate her course.  Recommend she push fluids to avoid dehydration.  I will consult podiatry after February 5 for her plantar fasciitis.

## 2019-10-04 ENCOUNTER — Encounter: Payer: Self-pay | Admitting: Podiatry

## 2019-10-04 ENCOUNTER — Other Ambulatory Visit: Payer: Self-pay

## 2019-10-04 ENCOUNTER — Ambulatory Visit (INDEPENDENT_AMBULATORY_CARE_PROVIDER_SITE_OTHER): Payer: BC Managed Care – PPO | Admitting: Podiatry

## 2019-10-04 ENCOUNTER — Ambulatory Visit (INDEPENDENT_AMBULATORY_CARE_PROVIDER_SITE_OTHER): Payer: BC Managed Care – PPO

## 2019-10-04 VITALS — BP 159/95 | HR 85 | Temp 97.8°F | Resp 16

## 2019-10-04 DIAGNOSIS — M722 Plantar fascial fibromatosis: Secondary | ICD-10-CM

## 2019-10-04 MED ORDER — DICLOFENAC SODIUM 75 MG PO TBEC: 75.0000 mg | DELAYED_RELEASE_TABLET | Freq: Two times a day (BID) | ORAL | 2 refills | Status: DC

## 2019-10-04 NOTE — Patient Instructions (Signed)

## 2019-10-04 NOTE — Progress Notes (Signed)
   Subjective:    Patient ID: Jacqueline Vargas, female    DOB: June 09, 1961, 59 y.o.   MRN: [id number]  HPI    Review of Systems  All other systems reviewed and are negative.      Objective:   Physical Exam        Assessment & Plan:

## 2019-10-04 NOTE — Progress Notes (Signed)
Subjective:   Patient ID: Jacqueline Vargas, female   DOB: 59 y.o.   MRN: [id number]   HPI Patient presents stating she is been having problems with her heel for around 2 years and she had a cortisone injection in January which helped her temporarily.  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  Patient does not smoke likes to be active   Review of Systems  All other systems reviewed and are negative.       Objective:  Physical Exam Vitals and nursing note reviewed.  Constitutional:      Appearance: She is well-developed.  Pulmonary:     Effort: Pulmonary effort is normal.  Musculoskeletal:        General: Normal range of motion.  Skin:    General: Skin is warm.  Neurological:     Mental Status: She is alert.     Neurovascular status intact muscle strength found to be adequate range of motion within normal limits.  Patient is noted to have exquisite discomfort plantar aspect left heel at the insertional point tendon calcaneus with inflammation fluid around the medial band and is noted to have a cavus foot structure that increases the pressure on the heel     Assessment:  Acute plantar fasciitis left with inflammation fluid buildup around the medial band     Plan:  H&P reviewed condition cavus foot structure discussed long-term orthotics due to her standing all day.  Today on focusing on the acute inflammation I did sterile prep injected the fascia 3 mg Kenalog 5 mg Xylocaine applied fascial brace placed on diclofenac 75 mg twice daily and reappoint to recheck  X-rays indicate cavus foot structure with spur formation plantar heel no indication stress fracture arthritis

## 2019-10-18 ENCOUNTER — Encounter: Payer: Self-pay | Admitting: Podiatry

## 2019-10-18 ENCOUNTER — Ambulatory Visit (INDEPENDENT_AMBULATORY_CARE_PROVIDER_SITE_OTHER): Payer: BC Managed Care – PPO | Admitting: Podiatry

## 2019-10-18 ENCOUNTER — Other Ambulatory Visit: Payer: Self-pay

## 2019-10-18 VITALS — Temp 97.9°F

## 2019-10-18 DIAGNOSIS — M722 Plantar fascial fibromatosis: Secondary | ICD-10-CM | POA: Diagnosis not present

## 2019-10-18 MED ORDER — DICLOFENAC SODIUM 75 MG PO TBEC: 75.0000 mg | DELAYED_RELEASE_TABLET | Freq: Two times a day (BID) | ORAL | 2 refills | Status: DC

## 2019-10-20 NOTE — Progress Notes (Signed)
Subjective:   Patient ID: Jacqueline Vargas, female   DOB: 59 y.o.   MRN: [id number]   HPI Patient presents stating she is still getting quite a bit of discomfort in the heel region left over right and states that she has had some improvement but she feels like it is been an ongoing long-term problem   ROS      Objective:  Physical Exam  Neurovascular status intact with continued discomfort plantar fascia with moderate improvement of the acute inflammation from previous visit     Assessment:  Chronic plantar fasciitis bilateral     Plan:  Reviewed condition and structural flattening of the arch bilateral with chronic plantar fascial symptomatology and at this time went ahead and casted for functional orthotic devices.  Patient was given all education concerning them and will have them dispensed by ped orthotist and will see me if symptoms were to get worse again

## 2019-11-15 ENCOUNTER — Ambulatory Visit: Payer: BC Managed Care – PPO | Admitting: Orthotics

## 2019-11-15 ENCOUNTER — Other Ambulatory Visit: Payer: Self-pay

## 2019-11-15 DIAGNOSIS — M722 Plantar fascial fibromatosis: Secondary | ICD-10-CM

## 2019-11-15 NOTE — Progress Notes (Signed)
Patient came in today to pick up custom made foot orthotics.  The goals were accomplished and the patient reported no dissatisfaction with said orthotics.  Patient was advised of breakin period and how to report any issues. 

## 2020-07-10 ENCOUNTER — Encounter: Payer: Self-pay | Admitting: Emergency Medicine

## 2020-07-10 ENCOUNTER — Ambulatory Visit (INDEPENDENT_AMBULATORY_CARE_PROVIDER_SITE_OTHER): Payer: BC Managed Care – PPO

## 2020-07-10 ENCOUNTER — Other Ambulatory Visit: Payer: Self-pay

## 2020-07-10 ENCOUNTER — Ambulatory Visit
Admission: EM | Admit: 2020-07-10 | Discharge: 2020-07-10 | Disposition: A | Discharge status: Home / Self Care | Payer: BC Managed Care – PPO | Attending: Family Medicine | Admitting: Family Medicine

## 2020-07-10 DIAGNOSIS — M79641 Pain in right hand: Secondary | ICD-10-CM | POA: Diagnosis not present

## 2020-07-10 DIAGNOSIS — W19XXXA Unspecified fall, initial encounter: Secondary | ICD-10-CM | POA: Diagnosis not present

## 2020-07-10 NOTE — Discharge Instructions (Addendum)
I will call you if radiology sees anything on xray  May take ibuprofen and tylenol for pain, aleve. May use ice and keep the hand elevated.   Follow up with this office or with primary care if symptoms are persisting.  Follow up in the ER for high fever, trouble swallowing, trouble breathing, other concerning symptoms.

## 2020-07-10 NOTE — ED Provider Notes (Signed)
[facility]   [id number] 07/10/20 Arrival Time: 1310  [place]:OVZCH PAIN  SUBJECTIVE: History from: patient. Jacqueline Vargas is a 60 y.o. female complains of right hand pain that began this morning after a fall as she was carrying trash down to the curb.  Reports that she twisted her ankle and that caused the fall. Describes the pain as constant and achy in character. Has tried OTC medications without relief. Symptoms are made worse with activity. Denies similar symptoms in the past. Denies fever, chills, weakness, numbness and tingling, saddle paresthesias, loss of bowel or bladder function.      ROS: As per HPI.  All other pertinent ROS negative.     History reviewed. No pertinent past medical history. Past Surgical History:  Procedure Laterality Date  . abnormal pap  1984  . TUBAL LIGATION     No Known Allergies No current facility-administered medications on file prior to encounter.   Current Outpatient Medications on File Prior to Encounter  Medication Sig Dispense Refill  . diclofenac (VOLTAREN) 75 MG EC tablet Take 1 tablet (75 mg total) by mouth 2 (two) times daily. 60 tablet 2   Social History   Socioeconomic History  . Marital status: Married    Spouse name: Not on file  . Number of children: Not on file  . Years of education: Not on file  . Highest education level: Not on file  Occupational History  . Not on file  Tobacco Use  . Smoking status: Former Smoker    Types: Cigarettes    Quit date: 02/05/2005    Years since quitting: 15.4  . Smokeless tobacco: Current User  Substance and Sexual Activity  . Alcohol use: Yes  . Drug use: No  . Sexual activity: Yes    Birth control/protection: None  Other Topics Concern  . Not on file  Social History Narrative  . Not on file   Social Determinants of Health   Financial Resource Strain: Not on file  Food Insecurity: Not on file  Transportation Needs: Not on file  Physical Activity: Not on file  Stress:  Not on file  Social Connections: Not on file  Intimate Partner Violence: Not on file   Family History  Problem Relation Age of Onset  . Aneurysm Mother   . Heart disease Paternal Grandmother   . Heart disease Paternal Grandfather     OBJECTIVE:  Vitals:   07/10/20 1324  BP: (!) 161/83  Pulse: 82  Resp: 20  Temp: 98.2 F (36.8 C)  TempSrc: Oral  SpO2: 98%    General appearance: ALERT; in no acute distress.  Head: NCAT Lungs: Normal respiratory effort CV:  pulses 2+ bilaterally. Cap refill < 2 seconds Musculoskeletal:  Inspection: Skin warm, dry, clear and intact. R 3rd and 4th fingers bruised and mildly swollen Palpation: R 3rd and 4th fingers tender to palpation ROM: limited ROM active and passive to r 3rd and 4th fingers Skin: warm and dry Neurologic: Ambulates without difficulty; Sensation intact about the upper/ lower extremities Psychological: alert and cooperative; normal mood and affect  DIAGNOSTIC STUDIES:  ASSESSMENT & PLAN:  1. Right hand pain   2. Fall, initial encounter    X-rays today are negative for fractures or dislocations Continue conservative management of rest, ice, and gentle stretches Take ibuprofen as needed for pain relief (may cause abdominal discomfort, ulcers, and GI bleeds avoid taking with other NSAIDs) Follow up with PCP if symptoms persist Return or go to the ER  if you have any new or worsening symptoms (fever, chills, chest pain, abdominal pain, changes in bowel or bladder habits, pain radiating into lower legs)  Reviewed expectations re: course of current medical issues. Questions answered. Outlined signs and symptoms indicating need for more acute intervention. Patient verbalized understanding. After Visit Summary given.       Moshe Cipro, NP 07/14/20 1303

## 2020-07-10 NOTE — ED Triage Notes (Signed)
Pt c/o of pain to right hand s/p fall this morning as she was carrying trash to curb.  She twisted ankle causing fall. No deformity noted. Able to move all digits.

## 2021-02-26 ENCOUNTER — Ambulatory Visit (INDEPENDENT_AMBULATORY_CARE_PROVIDER_SITE_OTHER): Payer: BC Managed Care – PPO | Admitting: Family Medicine

## 2021-02-26 ENCOUNTER — Other Ambulatory Visit: Payer: Self-pay

## 2021-02-26 ENCOUNTER — Encounter: Payer: Self-pay | Admitting: Family Medicine

## 2021-02-26 VITALS — BP 130/70 | HR 80 | Temp 97.0°F | Resp 14 | Wt 142.2 lb

## 2021-02-26 DIAGNOSIS — Z780 Asymptomatic menopausal state: Secondary | ICD-10-CM

## 2021-02-26 DIAGNOSIS — Z1211 Encounter for screening for malignant neoplasm of colon: Secondary | ICD-10-CM

## 2021-02-26 DIAGNOSIS — Z124 Encounter for screening for malignant neoplasm of cervix: Secondary | ICD-10-CM

## 2021-02-26 DIAGNOSIS — Z1231 Encounter for screening mammogram for malignant neoplasm of breast: Secondary | ICD-10-CM

## 2021-02-26 DIAGNOSIS — Z0000 Encounter for general adult medical examination without abnormal findings: Secondary | ICD-10-CM | POA: Diagnosis not present

## 2021-02-26 NOTE — Progress Notes (Signed)
Subjective:    Patient ID: Jacqueline Vargas, female    DOB: 04/20/1961, 60 y.o.   MRN: [id number]  HPI  Patient is a very sweet 60 year old Caucasian female here today to get scheduled for a colonoscopy.  She had a negative Cologuard in 2016 but has not had any colon cancer screening since.  However upon discussing the situation, she has not had a Pap smear in more than 8 years.  She has not had a mammogram since 2013.  Her last bone density was 19 years ago and showed mild osteopenia at that time.  She is overdue for fasting lab work.  She is really due for a complete physical exam.  Patient states that she would be fine to do the Pap smear today while she is here.  She would like to return fasting so she can draw the blood work because she is eaten something today No past medical history on file. Past Surgical History:  Procedure Laterality Date   abnormal pap  1984   TUBAL LIGATION     Current Outpatient Medications on File Prior to Visit  Medication Sig Dispense Refill   diclofenac (VOLTAREN) 75 MG EC tablet Take 1 tablet (75 mg total) by mouth 2 (two) times daily. (Patient not taking: Reported on 02/26/2021) 60 tablet 2   No current facility-administered medications on file prior to visit.   No Known Allergies Social History   Socioeconomic History   Marital status: Married    Spouse name: Not on file   Number of children: Not on file   Years of education: Not on file   Highest education level: Not on file  Occupational History   Not on file  Tobacco Use   Smoking status: Former    Types: Cigarettes    Quit date: 02/05/2005    Years since quitting: 16.0   Smokeless tobacco: Current  Substance and Sexual Activity   Alcohol use: Yes   Drug use: No   Sexual activity: Yes    Birth control/protection: None  Other Topics Concern   Not on file  Social History Narrative   Not on file   Social Determinants of Health   Financial Resource Strain: Not on file  Food  Insecurity: Not on file  Transportation Needs: Not on file  Physical Activity: Not on file  Stress: Not on file  Social Connections: Not on file  Intimate Partner Violence: Not on file   Family History  Problem Relation Age of Onset   Aneurysm Mother    Heart disease Paternal Grandmother    Heart disease Paternal Grandfather      Review of Systems  All other systems reviewed and are negative.     Objective:   Physical Exam Vitals reviewed. Exam conducted with a chaperone present.  Constitutional:      General: She is not in acute distress.    Appearance: Normal appearance. She is normal weight. She is not ill-appearing or toxic-appearing.  HENT:     Head: Normocephalic and atraumatic.     Right Ear: Tympanic membrane and ear canal normal.     Left Ear: Tympanic membrane and ear canal normal.     Mouth/Throat:     Mouth: Mucous membranes are moist.     Pharynx: Oropharynx is clear.  Eyes:     General: No scleral icterus.    Extraocular Movements: Extraocular movements intact.     Conjunctiva/sclera: Conjunctivae normal.     Pupils: Pupils are equal, round,  and reactive to light.  Neck:     Vascular: No carotid bruit.  Cardiovascular:     Rate and Rhythm: Normal rate and regular rhythm.     Pulses: Normal pulses.     Heart sounds: Normal heart sounds. No murmur heard.   No friction rub. No gallop.  Pulmonary:     Effort: Pulmonary effort is normal. No respiratory distress.     Breath sounds: Normal breath sounds. No stridor. No wheezing, rhonchi or rales.  Chest:     Chest wall: No tenderness.  Abdominal:     General: There is no distension.     Tenderness: There is no abdominal tenderness. There is no guarding or rebound.  Genitourinary:    General: Normal vulva.     Exam position: Lithotomy position.     Labia:        Right: No rash.        Left: No rash.      Vagina: Normal.     Cervix: No cervical motion tenderness, discharge, friability, lesion, erythema  or cervical bleeding.     Uterus: Normal.   Musculoskeletal:     Cervical back: Normal range of motion and neck supple.  Neurological:     General: No focal deficit present.     Mental Status: She is alert. Mental status is at baseline.     Cranial Nerves: No cranial nerve deficit.     Motor: No weakness.     Gait: Gait normal.          Assessment & Plan:   Encounter for screening mammogram for malignant neoplasm of breast - Plan: MM Digital Screening  Postmenopausal estrogen deficiency - Plan: DG Bone Density  Cervical cancer screening - Plan: PAP, Thin Prep w/HPV rflx HPV Type 16/18  Colon cancer screening - Plan: Ambulatory referral to Gastroenterology  General medical exam - Plan: CBC with Differential/Platelet, COMPLETE METABOLIC PANEL WITH GFR, Lipid panel I will schedule the patient for mammogram.  I will schedule the patient for colonoscopy.  I will schedule the patient for a bone density test.  Return fasting for a CBC CMP and fasting lipid panel.  Pap smear was performed today and sent to pathology in a labeled container.

## 2021-02-27 ENCOUNTER — Encounter (INDEPENDENT_AMBULATORY_CARE_PROVIDER_SITE_OTHER): Payer: Self-pay | Admitting: *Deleted

## 2021-02-28 ENCOUNTER — Other Ambulatory Visit: Payer: BC Managed Care – PPO

## 2021-02-28 ENCOUNTER — Other Ambulatory Visit: Payer: Self-pay

## 2021-02-28 DIAGNOSIS — Z136 Encounter for screening for cardiovascular disorders: Secondary | ICD-10-CM | POA: Diagnosis not present

## 2021-02-28 DIAGNOSIS — Z0000 Encounter for general adult medical examination without abnormal findings: Secondary | ICD-10-CM | POA: Diagnosis not present

## 2021-02-28 DIAGNOSIS — Z13220 Encounter for screening for lipoid disorders: Secondary | ICD-10-CM | POA: Diagnosis not present

## 2021-02-28 LAB — COMPLETE METABOLIC PANEL WITH GFR
AG Ratio: 1.5 (calc) (ref 1.0–2.5)
ALT: 14 U/L (ref 6–29)
AST: 15 U/L (ref 10–35)
Albumin: 4.1 g/dL (ref 3.6–5.1)
Alkaline phosphatase (APISO): 48 U/L (ref 37–153)
BUN: 18 mg/dL (ref 7–25)
CO2: 28 mmol/L (ref 20–32)
Calcium: 9.2 mg/dL (ref 8.6–10.4)
Chloride: 105 mmol/L (ref 98–110)
Creat: 0.52 mg/dL (ref 0.50–1.05)
Globulin: 2.8 g/dL (calc) (ref 1.9–3.7)
Glucose, Bld: 82 mg/dL (ref 65–99)
Potassium: 4.6 mmol/L (ref 3.5–5.3)
Sodium: 138 mmol/L (ref 135–146)
Total Bilirubin: 0.4 mg/dL (ref 0.2–1.2)
Total Protein: 6.9 g/dL (ref 6.1–8.1)
eGFR: 105 mL/min/{1.73_m2} (ref >=60)

## 2021-02-28 LAB — CBC WITH DIFFERENTIAL/PLATELET
Absolute Monocytes: 397 cells/uL (ref 200–950)
Basophils Absolute: 22 cells/uL (ref 0–200)
Basophils Relative: 0.7 %
Eosinophils Absolute: 0 cells/uL — ABNORMAL LOW (ref 15–500)
Eosinophils Relative: 0 %
HCT: 40.3 % (ref 35.0–45.0)
Hemoglobin: 13.3 g/dL (ref 11.7–15.5)
Lymphs Abs: 1048 cells/uL (ref 850–3900)
MCH: 31.8 pg (ref 27.0–33.0)
MCHC: 33 g/dL (ref 32.0–36.0)
MCV: 96.4 fL (ref 80.0–100.0)
MPV: 10.7 fL (ref 7.5–12.5)
Monocytes Relative: 12.8 %
Neutro Abs: 1634 cells/uL (ref 1500–7800)
Neutrophils Relative %: 52.7 %
Platelets: 241 10*3/uL (ref 140–400)
RBC: 4.18 10*6/uL (ref 3.80–5.10)
RDW: 12.9 % (ref 11.0–15.0)
Total Lymphocyte: 33.8 %
WBC: 3.1 10*3/uL — ABNORMAL LOW (ref 3.8–10.8)

## 2021-02-28 LAB — LIPID PANEL
Cholesterol: 198 mg/dL (ref <200)
HDL: 66 mg/dL (ref >=50)
LDL Cholesterol (Calc): 111 mg/dL (calc) — ABNORMAL HIGH
Non-HDL Cholesterol (Calc): 132 mg/dL (calc) — ABNORMAL HIGH (ref <130)
Total CHOL/HDL Ratio: 3 (calc) (ref <5.0)
Triglycerides: 103 mg/dL (ref <150)

## 2021-03-01 LAB — PAP, TP IMAGING W/ HPV RNA, RFLX HPV TYPE 16,18/45: HPV DNA High Risk: NOT DETECTED

## 2021-04-24 ENCOUNTER — Encounter (INDEPENDENT_AMBULATORY_CARE_PROVIDER_SITE_OTHER): Payer: Self-pay

## 2021-04-24 ENCOUNTER — Telehealth (INDEPENDENT_AMBULATORY_CARE_PROVIDER_SITE_OTHER): Payer: Self-pay

## 2021-04-24 ENCOUNTER — Other Ambulatory Visit (INDEPENDENT_AMBULATORY_CARE_PROVIDER_SITE_OTHER): Payer: Self-pay

## 2021-04-24 DIAGNOSIS — Z1211 Encounter for screening for malignant neoplasm of colon: Secondary | ICD-10-CM

## 2021-04-24 MED ORDER — PEG 3350-KCL-NA BICARB-NACL 420 G PO SOLR: 4000.0000 mL | ORAL | 0 refills | Status: DC

## 2021-04-24 NOTE — Telephone Encounter (Signed)
Jacqueline Vargas, CMA  

## 2021-04-24 NOTE — Telephone Encounter (Signed)
Referring MD/PCP: PICKARD  Procedure: TCS  Reason/Indication:  SCREENING  Has patient had this procedure before?  YES  If so, when, by whom and where? 2011   Is there a family history of colon cancer?  NO  Who?  What age when diagnosed?    Is patient diabetic? If yes, Type 1 or Type 2   NO      Does patient have prosthetic heart valve or mechanical valve?  NO  Do you have a pacemaker/defibrillator?  NO  Has patient ever had endocarditis/atrial fibrillation? NO  Does patient use oxygen? NO  Has patient had joint replacement within last 12 months?  NO  Is patient constipated or do they take laxatives? NO  Does patient have a history of alcohol/drug use?  NO  Have you had a stroke/heart attack last 6 mths? NO  Do you take medicine for weight loss?  NO  For female patients,: do you still have your menstrual cycle? NO  Is patient on blood thinner such as Coumadin, Plavix and/or Aspirin? NO  Medications: NONE  Allergies: NKDA  Medication Adjustment per Dr Karilyn Cota NONE  Procedure date & time: Thursday 05/03/21 8:30 am

## 2021-05-01 ENCOUNTER — Encounter (INDEPENDENT_AMBULATORY_CARE_PROVIDER_SITE_OTHER): Payer: Self-pay

## 2021-05-03 ENCOUNTER — Encounter (HOSPITAL_COMMUNITY): Payer: Self-pay | Admitting: Internal Medicine

## 2021-05-03 ENCOUNTER — Ambulatory Visit (HOSPITAL_COMMUNITY)
Admission: RE | Admit: 2021-05-03 | Discharge: 2021-05-03 | Disposition: A | Discharge status: Home / Self Care | Payer: BC Managed Care – PPO | Attending: Internal Medicine | Admitting: Internal Medicine

## 2021-05-03 ENCOUNTER — Other Ambulatory Visit: Payer: Self-pay

## 2021-05-03 ENCOUNTER — Encounter (HOSPITAL_COMMUNITY)
Admission: RE | Disposition: A | Discharge status: Home / Self Care | Payer: Self-pay | Source: Home / Self Care | Attending: Internal Medicine

## 2021-05-03 ENCOUNTER — Encounter (INDEPENDENT_AMBULATORY_CARE_PROVIDER_SITE_OTHER): Payer: Self-pay | Admitting: *Deleted

## 2021-05-03 DIAGNOSIS — K5730 Diverticulosis of large intestine without perforation or abscess without bleeding: Secondary | ICD-10-CM | POA: Insufficient documentation

## 2021-05-03 DIAGNOSIS — K644 Residual hemorrhoidal skin tags: Secondary | ICD-10-CM | POA: Diagnosis not present

## 2021-05-03 DIAGNOSIS — Z87891 Personal history of nicotine dependence: Secondary | ICD-10-CM | POA: Diagnosis not present

## 2021-05-03 DIAGNOSIS — Z1211 Encounter for screening for malignant neoplasm of colon: Secondary | ICD-10-CM | POA: Diagnosis not present

## 2021-05-03 HISTORY — PX: COLONOSCOPY: SHX5424

## 2021-05-03 LAB — HM COLONOSCOPY

## 2021-05-03 SURGERY — COLONOSCOPY
Anesthesia: Moderate Sedation

## 2021-05-03 MED ORDER — SODIUM CHLORIDE 0.9 % IV SOLN
INTRAVENOUS | Status: DC
  Administered 2021-05-03: 1000 mL via INTRAVENOUS

## 2021-05-03 MED ORDER — MEPERIDINE HCL 50 MG/ML IJ SOLN
INTRAMUSCULAR | Status: DC | PRN
  Administered 2021-05-03 (×2): 25 mg via INTRAVENOUS

## 2021-05-03 MED ORDER — MIDAZOLAM HCL 5 MG/5ML IJ SOLN
INTRAMUSCULAR | Status: AC
  Filled 2021-05-03: qty 10

## 2021-05-03 MED ORDER — MEPERIDINE HCL 50 MG/ML IJ SOLN
INTRAMUSCULAR | Status: AC
  Filled 2021-05-03: qty 1

## 2021-05-03 MED ORDER — MIDAZOLAM HCL 5 MG/5ML IJ SOLN
INTRAMUSCULAR | Status: DC | PRN
  Administered 2021-05-03 (×2): 2 mg via INTRAVENOUS
  Administered 2021-05-03: 1 mg via INTRAVENOUS

## 2021-05-03 NOTE — Op Note (Signed)
[facility] Patient Name: Jacqueline Vargas Procedure Date: 05/03/2021 8:26 AM MRN: [id number] Date of Birth: 1960/10/24 Attending MD: Lionel December , MD CSN: [id number] Age: 60 Admit Type: Outpatient Procedure:                Colonoscopy Indications:              Screening for colorectal malignant neoplasm Providers:                Lionel December, MD, Angelica Ran, Kristine L. Jessee Avers, Technician Referring MD:             Lynnea Ferrier, MD Medicines:                Meperidine 50 mg IV, Midazolam 5 mg IV Complications:            No immediate complications. Estimated Blood Loss:     Estimated blood loss: none. Procedure:                Pre-Anesthesia Assessment:                           - Prior to the procedure, a History and Physical                            was performed, and patient medications and                            allergies were reviewed. The patient's tolerance of                            previous anesthesia was also reviewed. The risks                            and benefits of the procedure and the sedation                            options and risks were discussed with the patient.                            All questions were answered, and informed consent                            was obtained. Prior Anticoagulants: The patient has                            taken no previous anticoagulant or antiplatelet                            agents. ASA Grade Assessment: I - A normal, healthy                            patient. After reviewing the risks and benefits,  the patient was deemed in satisfactory condition to                            undergo the procedure.                           After obtaining informed consent, the colonoscope                            was passed under direct vision. Throughout the                            procedure, the patient's blood pressure, pulse, and                             oxygen saturations were monitored continuously. The                            PCF-HQ190L ([id number]) scope was introduced through                            the anus and advanced to the the cecum, identified                            by appendiceal orifice and ileocecal valve. The                            colonoscopy was performed without difficulty. The                            patient tolerated the procedure well. The quality                            of the bowel preparation was excellent. The                            ileocecal valve, appendiceal orifice, and rectum                            were photographed. Scope In: 8:52:38 AM Scope Out: 9:11:53 AM Scope Withdrawal Time: 0 hours 6 minutes 22 seconds  Total Procedure Duration: 0 hours 19 minutes 15 seconds  Findings:      The perianal and digital rectal examinations were normal.      Two small-mouthed diverticula were found in the sigmoid colon.      External hemorrhoids were found during retroflexion. The hemorrhoids       were small. Impression:               - Diverticulosis in the sigmoid colon.                           - External hemorrhoids.                           - No specimens collected. Moderate Sedation:  Moderate (conscious) sedation was administered by the endoscopy nurse       and supervised by the endoscopist. The following parameters were       monitored: oxygen saturation, heart rate, blood pressure, CO2       capnography and response to care. Total physician intraservice time was       24 minutes. Recommendation:           - Patient has a contact number available for                            emergencies. The signs and symptoms of potential                            delayed complications were discussed with the                            patient. Return to normal activities tomorrow.                            Written discharge instructions were provided to the                             patient.                           - Resume previous diet today.                           - Continue present medications.                           - Repeat colonoscopy in 10 years for screening                            purposes. Procedure Code(s):        --- Professional ---                           [phone], Colonoscopy, flexible; diagnostic, including                            collection of specimen(s) by brushing or washing,                            when performed (separate procedure)                           99153, Moderate sedation; each additional 15                            minutes intraservice time                           G0500, Moderate sedation services provided by the                            same physician or  other qualified health care                            professional performing a gastrointestinal                            endoscopic service that sedation supports,                            requiring the presence of an independent trained                            observer to assist in the monitoring of the                            patient's level of consciousness and physiological                            status; initial 15 minutes of intra-service time;                            patient age 54 years or older (additional time may                            be reported with [id number], as appropriate) Diagnosis Code(s):        --- Professional ---                           Z12.11, Encounter for screening for malignant                            neoplasm of colon                           K64.4, Residual hemorrhoidal skin tags                           K57.30, Diverticulosis of large intestine without                            perforation or abscess without bleeding CPT copyright 2019 [employer]. All rights reserved. The codes documented in this report are preliminary and upon coder review may  be revised to meet current compliance  requirements. Lionel December, MD Lionel December, MD 05/03/2021 9:22:55 AM This report has been signed electronically. Number of Addenda: 0

## 2021-05-03 NOTE — Discharge Instructions (Signed)
Resume usual medications and diet as before. No driving for 24 hours. Next screening exam in 10 years. 

## 2021-05-03 NOTE — H&P (Signed)
Jacqueline Vargas is an 60 y.o. female.   Chief Complaint: Patient is here for colonoscopy. HPI: Patient is 60 year old Caucasian female who is here for screening colonoscopy.  She denies abdominal pain change in bowel habits or rectal bleeding.  Her appetite is good and his weight has been stable.  Her last colonoscopy was in 2011 by Dr. Sheryn Bison and was incomplete exam.  She did not have any follow-up study. Patient does not take aspirin or NSAIDs. Family history is negative for CRC.  History reviewed. No pertinent past medical history.  Past Surgical History:  Procedure Laterality Date   abnormal pap  1984   TUBAL LIGATION      Family History  Problem Relation Age of Onset   Aneurysm Mother    Heart disease Paternal Grandmother    Heart disease Paternal Grandfather    Social History:  reports that she quit smoking about 16 years ago. Her smoking use included cigarettes. She uses smokeless tobacco. She reports current alcohol use. She reports that she does not use drugs.  Allergies: No Known Allergies  Medications Prior to Admission  Medication Sig Dispense Refill   Cholecalciferol (VITAMIN D3 PO) Take 1 capsule by mouth daily.     COLLAGEN PO Take 10 mLs by mouth daily. Power in coffee     polyethylene glycol-electrolytes (TRILYTE) 420 g solution Take 4,000 mLs by mouth as directed. 4000 mL 0    No results found for this or any previous visit (from the past 48 hour(s)). No results found.  Review of Systems  Blood pressure 139/75, pulse 73, temperature 98.3 F (36.8 C), temperature source Oral, resp. rate 17, SpO2 99 %. Physical Exam HENT:     Mouth/Throat:     Mouth: Mucous membranes are moist.     Pharynx: Oropharynx is clear.  Eyes:     General: No scleral icterus.    Conjunctiva/sclera: Conjunctivae normal.  Cardiovascular:     Rate and Rhythm: Normal rate and regular rhythm.     Heart sounds: Normal heart sounds. No murmur heard. Pulmonary:     Effort:  Pulmonary effort is normal.     Breath sounds: Normal breath sounds.  Abdominal:     General: Abdomen is flat. There is no distension.     Palpations: Abdomen is soft. There is no mass.     Tenderness: There is no abdominal tenderness.  Musculoskeletal:     Cervical back: Neck supple.  Lymphadenopathy:     Cervical: No cervical adenopathy.  Neurological:     Mental Status: She is alert.     Assessment/Plan  Average risk screening colonoscopy  Lionel December, MD 05/03/2021, 8:41 AM

## 2021-05-07 ENCOUNTER — Encounter (HOSPITAL_COMMUNITY): Payer: Self-pay | Admitting: Internal Medicine

## 2021-05-23 ENCOUNTER — Encounter: Payer: Self-pay | Admitting: Family Medicine

## 2021-11-22 IMAGING — DX DG HAND COMPLETE 3+V*R*
3 series · 3 of 3 positions shown · non-contrast
Comparison: None

CLINICAL DATA: Fell today, RIGHT hand pain greatest at fourth digit

EXAM:
RIGHT HAND - COMPLETE 3+ VIEW

[hand pa]
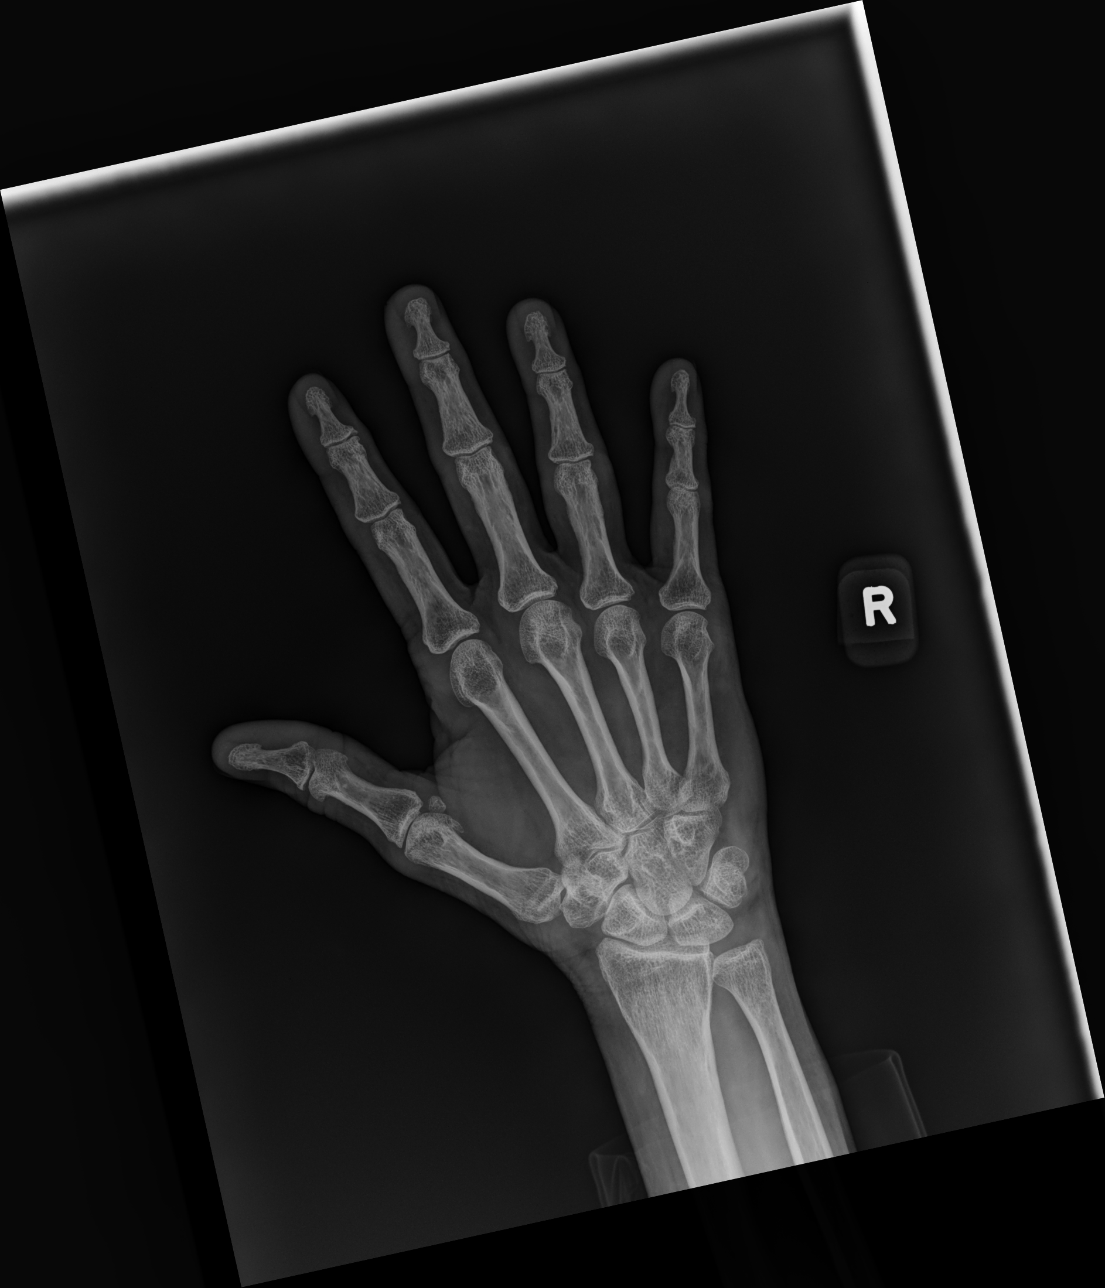

[hand mlo]
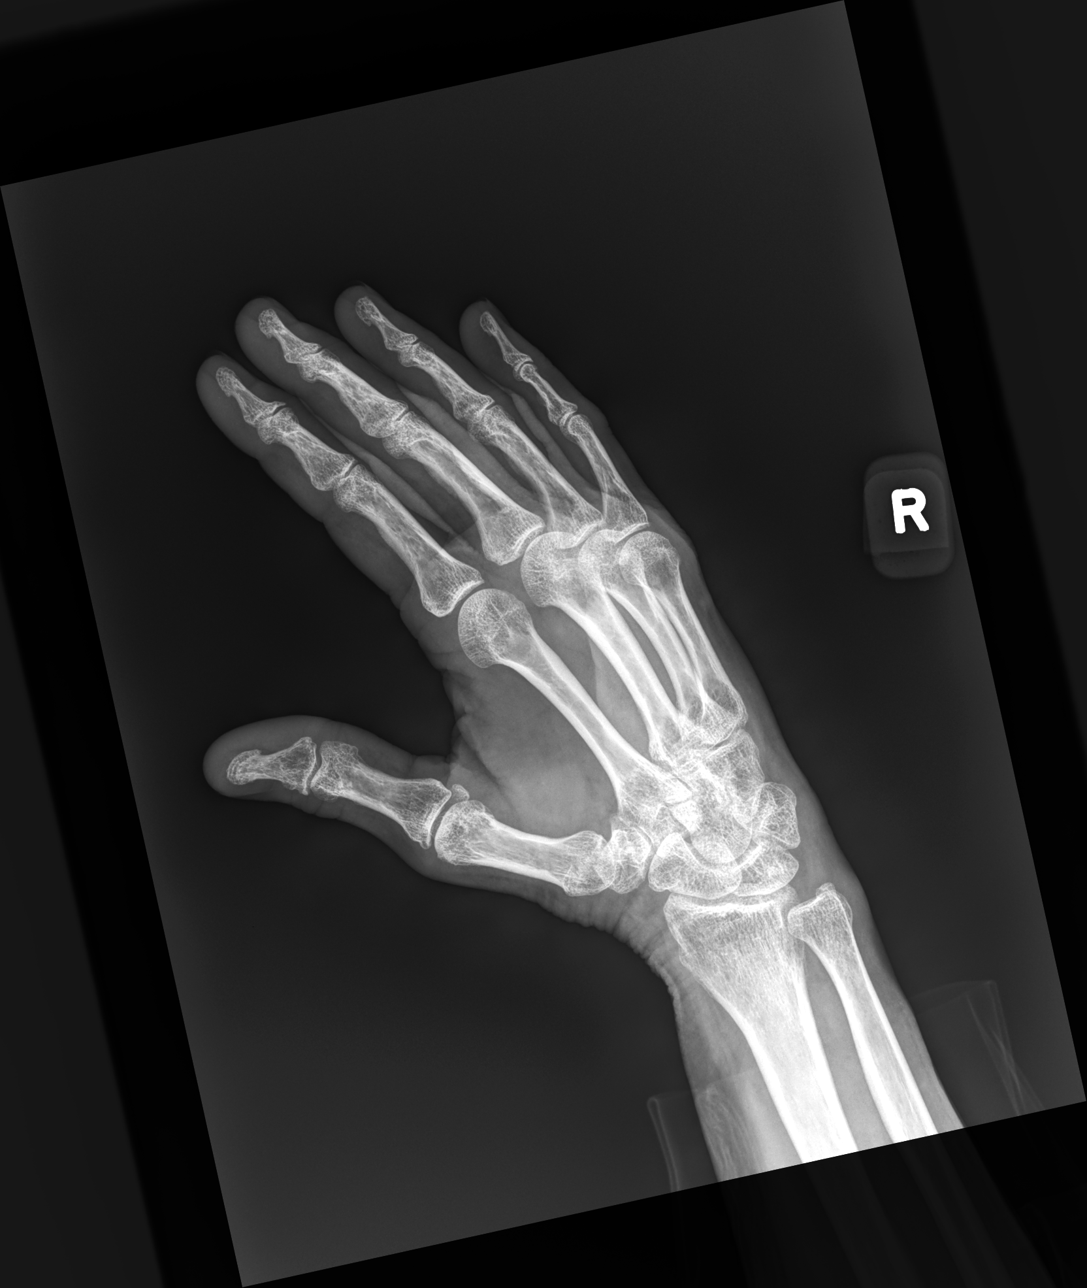

[hand lat]
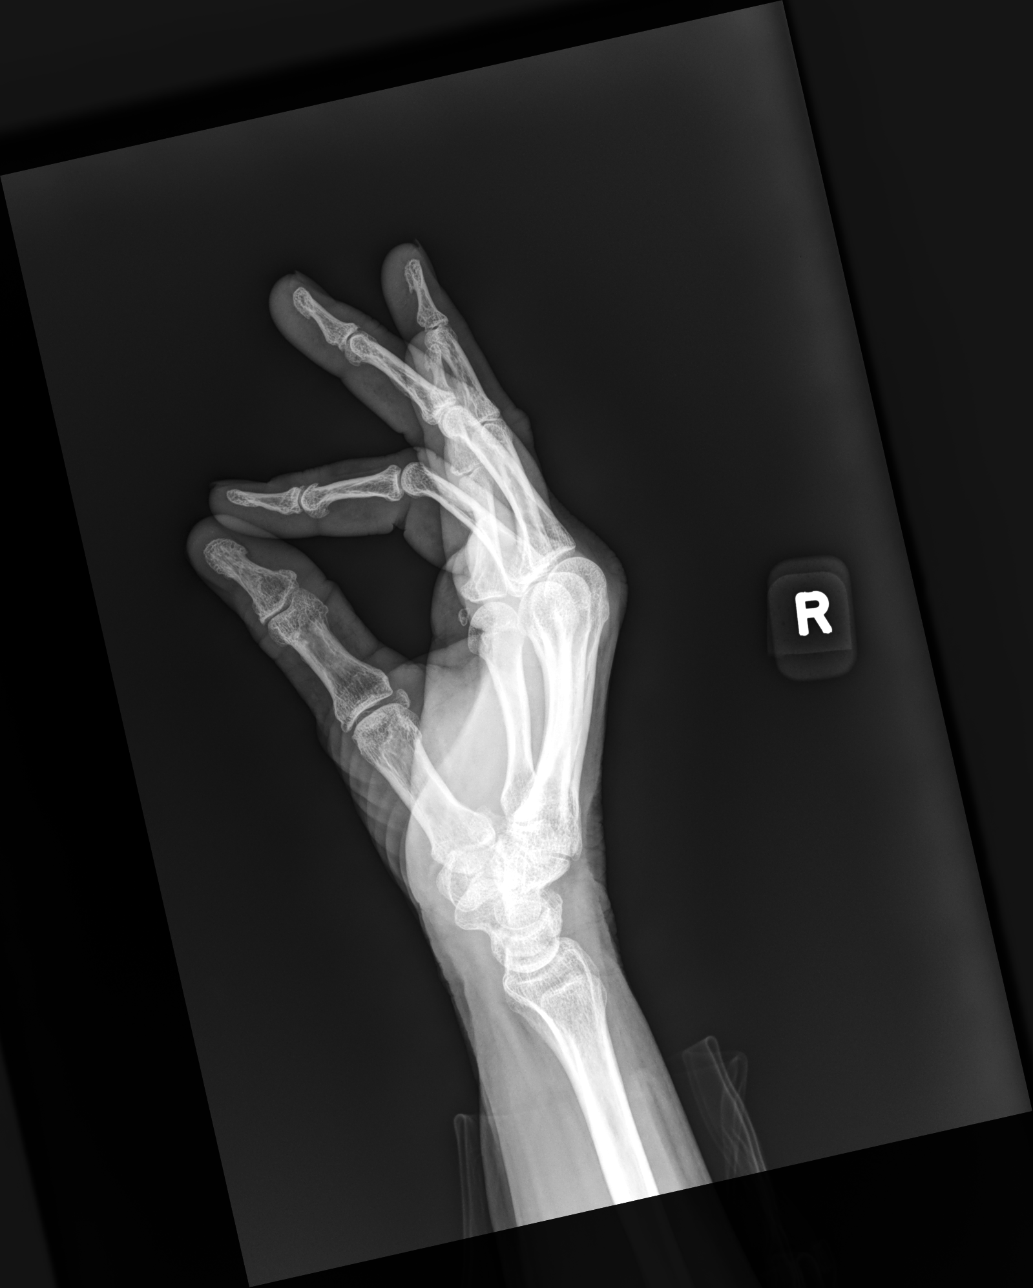

[3 of 3 positions shown; findings below may reference images not displayed]

FINDINGS: Osseous demineralization.

Joint space narrowing of radiocarpal joint and first MCP joint.

Soft tissue swelling at PIP joint ring finger.

No definite fracture, dislocation or bone destruction.

Fingers are partially superimposed on lateral view, limiting
assessment.
IMPRESSION: No definite acute osseous abnormalities.

## 2022-07-30 ENCOUNTER — Other Ambulatory Visit: Payer: Self-pay | Admitting: Family Medicine

## 2022-07-30 DIAGNOSIS — Z1231 Encounter for screening mammogram for malignant neoplasm of breast: Secondary | ICD-10-CM

## 2022-08-28 ENCOUNTER — Ambulatory Visit
Admission: RE | Admit: 2022-08-28 | Discharge: 2022-08-28 | Disposition: A | Discharge status: Home / Self Care | Payer: BC Managed Care – PPO | Source: Ambulatory Visit | Attending: Family Medicine | Admitting: Family Medicine

## 2022-08-28 DIAGNOSIS — Z1231 Encounter for screening mammogram for malignant neoplasm of breast: Secondary | ICD-10-CM | POA: Diagnosis not present

## 2023-02-24 ENCOUNTER — Ambulatory Visit (INDEPENDENT_AMBULATORY_CARE_PROVIDER_SITE_OTHER): Payer: BC Managed Care – PPO | Admitting: Family Medicine

## 2023-02-24 ENCOUNTER — Encounter: Payer: Self-pay | Admitting: Family Medicine

## 2023-02-24 VITALS — BP 120/70 | HR 80 | Temp 98.7°F | Ht 65.0 in | Wt 135.0 lb

## 2023-02-24 DIAGNOSIS — B029 Zoster without complications: Secondary | ICD-10-CM | POA: Insufficient documentation

## 2023-02-24 MED ORDER — VALACYCLOVIR HCL 1 G PO TABS: 1000.0000 mg | ORAL_TABLET | Freq: Three times a day (TID) | ORAL | 0 refills | Status: AC

## 2023-02-24 NOTE — Progress Notes (Signed)
Subjective:  HPI: Jacqueline Vargas is a 62 y.o. female presenting on 02/24/2023 for Follow-up (poss shingles - blishery rash (sx began 02/21/23) plus sciatic nerver pain/)   HPI Patient is in today for a rash to her mid lower back since 02/21/2023. The rash is painful, not itching, no drainage. No known allergen or plant exposure, recent travel, no pets. The rash is blistery today. She did have chicken pox when she was younger. No shingles vaccine. She has been taking Aleve without relief.   Review of Systems  All other systems reviewed and are negative.   Relevant past medical history reviewed and updated as indicated.   No past medical history on file.   Past Surgical History:  Procedure Laterality Date   abnormal pap  1984   COLONOSCOPY N/A 05/03/2021   Procedure: COLONOSCOPY;  Surgeon: Malissa Hippo, MD;  [facility]: [facility];  Service: Endoscopy;  Laterality: N/A;  8:30   TUBAL LIGATION      Allergies and medications reviewed and updated.   Current Outpatient Medications:    COLLAGEN PO, Take 10 mLs by mouth daily. Power in coffee, Disp: , Rfl:    valACYclovir (VALTREX) 1000 MG tablet, Take 1 tablet (1,000 mg total) by mouth 3 (three) times daily for 10 days., Disp: 30 tablet, Rfl: 0   Cholecalciferol (VITAMIN D3 PO), Take 1 capsule by mouth daily. (Patient not taking: Reported on 02/24/2023), Disp: , Rfl:   No Known Allergies  Objective:   BP 120/70   Pulse 80   Temp 98.7 F (37.1 C) (Oral)   Ht 5\' 5"  (1.651 m)   Wt 135 lb (61.2 kg)   SpO2 97%   BMI 22.47 kg/m      02/24/2023    3:39 PM 05/03/2021    9:17 AM 05/03/2021    9:10 AM  Vitals with BMI  Height 5\' 5"     Weight 135 lbs    BMI 22.47    Systolic 120 102 409  Diastolic 70 54 58  Pulse 80 76 85     Physical Exam Vitals and nursing note reviewed.  Constitutional:      Appearance: Normal appearance. She is normal weight.  HENT:     Head: Normocephalic and atraumatic.  Skin:     General: Skin is warm and dry.     Findings: Rash present. Rash is vesicular.          Comments: Vesicular rash present to left lower back and left buttocks  Neurological:     General: No focal deficit present.     Mental Status: She is alert and oriented to person, place, and time. Mental status is at baseline.  Psychiatric:        Mood and Affect: Mood normal.        Behavior: Behavior normal.        Thought Content: Thought content normal.        Judgment: Judgment normal.     Assessment & Plan:  Herpes zoster without complication Assessment & Plan: Patient presents with unilateral vesicular rash in dermatomal pattern to left lower back and left buttock consistent with herpes zoster. Will start valacyclovir 1000mg  TID for 10 days. Pain currently controlled with Aleve, can try Ibuprofen up to 600mg  q8h. Will augment for pain as needed. Instructed to notify office if symptoms are not controlled.   Other orders -     valACYclovir HCl; Take 1 tablet (1,000 mg total) by mouth 3 (three)  times daily for 10 days.  Dispense: 30 tablet; Refill: 0     Follow up plan: Return if symptoms worsen or fail to improve.  Park Meo, FNP

## 2023-02-24 NOTE — Assessment & Plan Note (Signed)
Patient presents with unilateral vesicular rash in dermatomal pattern to left lower back and left buttock consistent with herpes zoster. Will start valacyclovir 1000mg  TID for 10 days. Pain currently controlled with Aleve, can try Ibuprofen up to 600mg  q8h. Will augment for pain as needed. Instructed to notify office if symptoms are not controlled.

## 2023-02-25 ENCOUNTER — Ambulatory Visit: Payer: BC Managed Care – PPO | Admitting: Family Medicine

## 2023-03-12 ENCOUNTER — Telehealth: Payer: Self-pay | Admitting: Family Medicine

## 2023-03-12 NOTE — Telephone Encounter (Signed)
Patient's husband called on behalf of patient to follow up on recent visit with NP on 02/24/23; patient is now ready to accept provider's offer to call in something to the pharmacy to manage the pain caused by shingles.  Pharmacy confirmed as:    Please advise at [phone].

## 2023-03-13 ENCOUNTER — Other Ambulatory Visit: Payer: Self-pay | Admitting: Family Medicine

## 2023-03-13 ENCOUNTER — Ambulatory Visit: Payer: Self-pay | Admitting: *Deleted

## 2023-03-13 MED ORDER — HYDROCODONE-ACETAMINOPHEN 5-325 MG PO TABS: 1.0000 | ORAL_TABLET | Freq: Four times a day (QID) | ORAL | 0 refills | Status: DC | PRN

## 2023-03-13 NOTE — Telephone Encounter (Signed)
  Chief Complaint:  Pain S/P shingles Symptoms: Neuropathic type pain S/P shingles 03/03/23, States "Burning nerve like." 8-9/10 Frequency: Shingles 03/03/23 Pertinent Negatives: Patient denies fever Disposition: [] ED /[] Urgent Care (no appt availability in office) / [] Appointment(In office/virtual)/ []  [facility]/ [] Home Care/ [] Refused Recommended Disposition /[] [facility]/ [x]  Follow-up with PCP Additional Notes:  States rash is resolved.  Reports pain is in back, groin areas, left inner thigh. Reports front of left thigh feels numb. Pt has appt secured for tomorrow, requests something for pain be called in today if possible. Has tried OTC Aleve and IBU, ineffective.  Please advise.  If appropriate, [facility] in [place].  Reason for Disposition  [1] Shingles rash AND [2] onset > 72 hours ago (3 days)  Answer Assessment - Initial Assessment Questions 1. APPEARANCE of RASH: "Describe the rash."      No longer rash 2. LOCATION: "Where is the rash located?"      No rash 3. ONSET: "When did the rash start?"      03/03/23 4. ITCHING: "Does the rash itch?" If Yes, ask: "How bad is the itch?"  (Scale 1-10; or mild, moderate, severe)     No 5. PAIN: "Does the rash hurt?" If Yes, ask: "How bad is the pain?"  (Scale 0-10; or none, mild, moderate, severe)    - NONE (0): no pain    - MILD (1-3): doesn't interfere with normal activities     - MODERATE (4-7): interferes with normal activities or awakens from sleep     - SEVERE (8-10): excruciating pain, unable to do any normal activities     Nerve burning pain. Back, groin area, inside of left thigh. Front of left thigh feels numb. 6. OTHER SYMPTOMS: "Do you have any other symptoms?" (e.g., fever)     no  Protocols used: Shingles (Zoster)-A-AH

## 2023-03-14 ENCOUNTER — Ambulatory Visit (INDEPENDENT_AMBULATORY_CARE_PROVIDER_SITE_OTHER): Payer: BC Managed Care – PPO | Admitting: Family Medicine

## 2023-03-14 VITALS — BP 140/82 | HR 82 | Temp 98.6°F | Ht 65.0 in | Wt 132.2 lb

## 2023-03-14 DIAGNOSIS — B0229 Other postherpetic nervous system involvement: Secondary | ICD-10-CM

## 2023-03-14 MED ORDER — GABAPENTIN 300 MG PO CAPS: 300.0000 mg | ORAL_CAPSULE | Freq: Three times a day (TID) | ORAL | 3 refills | Status: DC | PRN

## 2023-03-14 NOTE — Progress Notes (Signed)
Subjective:    Patient ID: Jacqueline Vargas, female    DOB: [birth year], 62 y.o.   MRN: [id number]  HPI Patient was recently seen by my partner for shingles.  The rash began on her left gluteus and following the path of the S1 dermatome radiating around to her pelvic area and also down her anterior thigh.  The rash is classic shingles.  The rash is now seen by way the patient is having severe nerve pain in her left leg.  This is a patient who seldom complains that comes to the doctor however she states that she cannot sleep at night.  Her leg feels like it is on fire.  She has hyperesthesias in her leg all the way down to her feet.  She also has numbness and tingling in her leg.  The pain is limited to the area where the rash appeared.  The rash has subsided No past medical history on file. Past Surgical History:  Procedure Laterality Date   abnormal pap  1984   COLONOSCOPY N/A 05/03/2021   Procedure: COLONOSCOPY;  Surgeon: Malissa Hippo, MD;  Location: [facility];  Service: Endoscopy;  Laterality: N/A;  8:30   TUBAL LIGATION     Current Outpatient Medications on File Prior to Visit  Medication Sig Dispense Refill   Cholecalciferol (VITAMIN D3 PO) Take 1 capsule by mouth daily. (Patient not taking: Reported on 02/24/2023)     COLLAGEN PO Take 10 mLs by mouth daily. Power in coffee     HYDROcodone-acetaminophen (NORCO) 5-325 MG tablet Take 1 tablet by mouth every 6 (six) hours as needed for moderate pain. 30 tablet 0   No current facility-administered medications on file prior to visit.    No Known Allergies Social History   Socioeconomic History   Marital status: Married    Spouse name: Not on file   Number of children: Not on file   Years of education: Not on file   Highest education level: Not on file  Occupational History   Not on file  Tobacco Use   Smoking status: Former    Current packs/day: 0.00    Types: Cigarettes    Quit date: 02/05/2005    Years since quitting:  18.1   Smokeless tobacco: Current  Substance and Sexual Activity   Alcohol use: Yes   Drug use: No   Sexual activity: Yes    Birth control/protection: None  Other Topics Concern   Not on file  Social History Narrative   Not on file   Social Determinants of Health   Financial Resource Strain: Not on file  Food Insecurity: Not on file  Transportation Needs: Not on file  Physical Activity: Not on file  Stress: Not on file  Social Connections: Not on file  Intimate Partner Violence: Not on file      Review of Systems  All other systems reviewed and are negative.      Objective:   Physical Exam Vitals reviewed.  HENT:     Mouth/Throat:     Pharynx: No uvula swelling.  Cardiovascular:     Rate and Rhythm: Normal rate and regular rhythm.     Heart sounds: Normal heart sounds. No murmur heard. Pulmonary:     Effort: Pulmonary effort is normal. No respiratory distress.     Breath sounds: Normal breath sounds. No wheezing or rales.  Musculoskeletal:     Cervical back: Neck supple.     Left foot: No swelling.  Legs:          Assessment & Plan:  Postherpetic neuralgia I prescribed the patient hydrocodone yesterday.  This did not help with the pain.  We will add gabapentin 300 mg every 8 hours as needed for nerve pain and uptitrate as necessary.  I cautioned the patient about dizziness and sedation on gabapentin

## 2023-03-17 ENCOUNTER — Ambulatory Visit: Payer: BC Managed Care – PPO | Admitting: Family Medicine

## 2023-09-10 ENCOUNTER — Encounter: Payer: Self-pay | Admitting: Family Medicine

## 2023-09-10 ENCOUNTER — Ambulatory Visit (INDEPENDENT_AMBULATORY_CARE_PROVIDER_SITE_OTHER): Admitting: Family Medicine

## 2023-09-10 VITALS — BP 120/60 | HR 85 | Temp 98.4°F | Ht 65.0 in | Wt 134.0 lb

## 2023-09-10 DIAGNOSIS — J069 Acute upper respiratory infection, unspecified: Secondary | ICD-10-CM

## 2023-09-10 DIAGNOSIS — R051 Acute cough: Secondary | ICD-10-CM

## 2023-09-10 DIAGNOSIS — R059 Cough, unspecified: Secondary | ICD-10-CM

## 2023-09-10 LAB — INFLUENZA A AND B AG, IMMUNOASSAY
INFLUENZA A ANTIGEN: NOT DETECTED
INFLUENZA B ANTIGEN: NOT DETECTED

## 2023-09-10 NOTE — Progress Notes (Signed)
 Subjective:  HPI: Jacqueline Vargas is a 63 y.o. female presenting on 09/10/2023 for Acute Visit (congestion, cough, coughing up green phlegm,)   HPI Patient is in today for 3 days sinus congestion, aching, mucopurulent cough, fatigue, decrease appetite, postnasal drip Denies fever, chills, loss of taste, SOB, pleurisy. Has been exposed to someone who was ill at work, no home covid test Has tried Ibuprofen and Mucinex D   Review of Systems  All other systems reviewed and are negative.   Relevant past medical history reviewed and updated as indicated.   No past medical history on file.   Past Surgical History:  Procedure Laterality Date   abnormal pap  1984   COLONOSCOPY N/A 05/03/2021   Procedure: COLONOSCOPY;  Surgeon: Malissa Hippo, MD;  [facility];  Service: Endoscopy;  Laterality: N/A;  8:30   TUBAL LIGATION      Allergies and medications reviewed and updated.   Current Outpatient Medications:    COLLAGEN PO, Take 10 mLs by mouth daily. Power in coffee, Disp: , Rfl:    Cholecalciferol (VITAMIN D3 PO), Take 1 capsule by mouth daily. (Patient not taking: Reported on 09/10/2023), Disp: , Rfl:    gabapentin (NEURONTIN) 300 MG capsule, Take 1 capsule (300 mg total) by mouth 3 (three) times daily as needed (nerve pain). (Patient not taking: Reported on 09/10/2023), Disp: 90 capsule, Rfl: 3   HYDROcodone-acetaminophen (NORCO) 5-325 MG tablet, Take 1 tablet by mouth every 6 (six) hours as needed for moderate pain. (Patient not taking: Reported on 09/10/2023), Disp: 30 tablet, Rfl: 0  No Known Allergies  Objective:   BP 120/60   Pulse 85   Temp 98.4 F (36.9 C) (Oral)   Ht 5\' 5"  (1.651 m)   Wt 134 lb (60.8 kg)   SpO2 95%   BMI 22.30 kg/m      09/10/2023   10:05 AM 03/14/2023    3:39 PM 02/24/2023    3:39 PM  Vitals with BMI  Height 5\' 5"  5\' 5"  5\' 5"   Weight 134 lbs 132 lbs 3 oz 135 lbs  BMI 22.3 22 22.47  Systolic 120 140 409  Diastolic 60 82 70  Pulse  85 82 80     Physical Exam Vitals and nursing note reviewed.  Constitutional:      Appearance: Normal appearance. She is normal weight.  HENT:     Head: Normocephalic and atraumatic.     Right Ear: There is impacted cerumen.     Left Ear: Tympanic membrane, ear canal and external ear normal.     Nose: Congestion present.     Right Sinus: Maxillary sinus tenderness and frontal sinus tenderness present.     Left Sinus: Maxillary sinus tenderness and frontal sinus tenderness present.     Mouth/Throat:     Mouth: Mucous membranes are moist.     Pharynx: Oropharynx is clear.  Eyes:     Extraocular Movements: Extraocular movements intact.     Conjunctiva/sclera: Conjunctivae normal.  Cardiovascular:     Rate and Rhythm: Normal rate and regular rhythm.     Pulses: Normal pulses.     Heart sounds: Normal heart sounds.  Pulmonary:     Effort: Pulmonary effort is normal.     Breath sounds: Examination of the right-upper field reveals rhonchi. Examination of the left-upper field reveals rhonchi. Examination of the right-lower field reveals rhonchi. Examination of the left-lower field reveals rhonchi. Rhonchi present.  Skin:    General:  Skin is warm and dry.  Neurological:     General: No focal deficit present.     Mental Status: She is alert and oriented to person, place, and time. Mental status is at baseline.  Psychiatric:        Mood and Affect: Mood normal.        Behavior: Behavior normal.        Thought Content: Thought content normal.        Judgment: Judgment normal.     Assessment & Plan:  Viral URI with cough Assessment & Plan: Flu negative. She will take home covid test and report to office if positive. Reassured patient that symptoms and exam findings are most consistent with a viral upper respiratory infection and explained lack of efficacy of antibiotics against viruses.  Discussed expected course and features suggestive of secondary bacterial infection.  Continue  supportive care. Increase fluid intake with water or electrolyte solution like pedialyte. Encouraged acetaminophen as needed for fever/pain. Encouraged salt water gargling, chloraseptic spray and throat lozenges. Encouraged OTC guaifenesin. Encouraged saline sinus flushes and/or neti with humidified air.     Acute cough  Cough, unspecified type -     Influenza A and B Ag, Immunoassay     Follow up plan: Return if symptoms worsen or fail to improve.  Park Meo, FNP

## 2023-09-10 NOTE — Assessment & Plan Note (Signed)
 Flu negative. She will take home covid test and report to office if positive. Reassured patient that symptoms and exam findings are most consistent with a viral upper respiratory infection and explained lack of efficacy of antibiotics against viruses.  Discussed expected course and features suggestive of secondary bacterial infection.  Continue supportive care. Increase fluid intake with water or electrolyte solution like pedialyte. Encouraged acetaminophen as needed for fever/pain. Encouraged salt water gargling, chloraseptic spray and throat lozenges. Encouraged OTC guaifenesin. Encouraged saline sinus flushes and/or neti with humidified air.

## 2023-09-20 ENCOUNTER — Ambulatory Visit
Admission: EM | Admit: 2023-09-20 | Discharge: 2023-09-20 | Disposition: A | Discharge status: Home / Self Care | Attending: Nurse Practitioner | Admitting: Nurse Practitioner

## 2023-09-20 ENCOUNTER — Encounter: Payer: Self-pay | Admitting: Emergency Medicine

## 2023-09-20 DIAGNOSIS — J22 Unspecified acute lower respiratory infection: Secondary | ICD-10-CM

## 2023-09-20 DIAGNOSIS — R059 Cough, unspecified: Secondary | ICD-10-CM | POA: Diagnosis not present

## 2023-09-20 MED ORDER — IPRATROPIUM-ALBUTEROL 0.5-2.5 (3) MG/3ML IN SOLN
3.0000 mL | Freq: Once | RESPIRATORY_TRACT | Status: AC
  Administered 2023-09-20: 3 mL via RESPIRATORY_TRACT

## 2023-09-20 MED ORDER — METHYLPREDNISOLONE SODIUM SUCC 125 MG IJ SOLR
125.0000 mg | Freq: Once | INTRAMUSCULAR | Status: AC
  Administered 2023-09-20: 125 mg via INTRAMUSCULAR

## 2023-09-20 MED ORDER — AMOXICILLIN-POT CLAVULANATE 875-125 MG PO TABS: 1.0000 | ORAL_TABLET | Freq: Two times a day (BID) | ORAL | 0 refills | Status: AC

## 2023-09-20 MED ORDER — PROMETHAZINE-DM 6.25-15 MG/5ML PO SYRP: 5.0000 mL | ORAL_SOLUTION | Freq: Four times a day (QID) | ORAL | 0 refills | Status: AC | PRN

## 2023-09-20 MED ORDER — PREDNISONE 20 MG PO TABS: 40.0000 mg | ORAL_TABLET | Freq: Every day | ORAL | 0 refills | Status: AC

## 2023-09-20 NOTE — ED Triage Notes (Signed)
 Symptoms x 2 weeks.  Fatigue, cough, nasal congestion.  Was taking mucinex without relief.  Started taking dayquil and nyquil last night.  States chest feels tight

## 2023-09-20 NOTE — ED Provider Notes (Signed)
 [facility]    CSN: [id number] Arrival date & time: 09/20/23  1306      History   Chief Complaint No chief complaint on file.   HPI Jacqueline Vargas is a 63 y.o. female.   The history is provided by the patient.   Patient presents with a 2-week history of nasal congestion, fatigue, headache, and cough.  Patient states cough worsened over the past several days.  She states that she now has wheezing and shortness of breath with exertion.  Denies fever, chills, sore throat, ear pain, difficulty breathing, chest pain, abdominal pain, nausea, vomiting, diarrhea, or rash.  States that her chest feels "tight."  States she has been taking Mucinex, DayQuil, and NyQuil for symptoms with minimal relief.  History reviewed. No pertinent past medical history.  Patient Active Problem List   Diagnosis Date Noted   Viral URI with cough 09/10/2023   Herpes zoster without complication 02/24/2023    Past Surgical History:  Procedure Laterality Date   abnormal pap  1984   COLONOSCOPY N/A 05/03/2021   Procedure: COLONOSCOPY;  Surgeon: Malissa Hippo, MD;  Location: [facility];  Service: Endoscopy;  Laterality: N/A;  8:30   TUBAL LIGATION      OB History   No obstetric history on file.      Home Medications    Prior to Admission medications   Medication Sig Start Date End Date Taking? Authorizing Provider  COLLAGEN PO Take 10 mLs by mouth daily. Power in coffee    [provider]    Family History Family History  Problem Relation Age of Onset   Aneurysm Mother    Heart disease Paternal Grandmother    Heart disease Paternal Grandfather     Social History Social History   Tobacco Use   Smoking status: Former    Current packs/day: 0.00    Types: Cigarettes    Quit date: 02/05/2005    Years since quitting: 18.6   Smokeless tobacco: Current  Substance Use Topics   Alcohol use: Yes   Drug use: No     Allergies   Patient has no known  allergies.   Review of Systems Review of Systems Per HPI  Physical Exam Triage Vital Signs ED Triage Vitals  Encounter Vitals Group     BP 09/20/23 1342 118/68     Systolic BP Percentile --      Diastolic BP Percentile --      Pulse Rate 09/20/23 1342 79     Resp 09/20/23 1342 18     Temp 09/20/23 1342 98.6 F (37 C)     Temp Source 09/20/23 1342 Oral     SpO2 09/20/23 1342 94 %     Weight --      Height --      Head Circumference --      Peak Flow --      Pain Score 09/20/23 1344 5     Pain Loc --      Pain Education --      Exclude from Growth Chart --    No data found.  Updated Vital Signs BP 118/68 (BP Location: Right Arm)   Pulse 79   Temp 98.6 F (37 C) (Oral)   Resp 18   SpO2 94%   Visual Acuity Right Eye Distance:   Left Eye Distance:   Bilateral Distance:    Right Eye Near:   Left Eye Near:    Bilateral Near:  Physical Exam Vitals and nursing note reviewed.  Constitutional:      General: She is not in acute distress.    Appearance: Normal appearance.  HENT:     Head: Normocephalic.     Right Ear: Tympanic membrane, ear canal and external ear normal.     Left Ear: Tympanic membrane, ear canal and external ear normal.     Nose: Congestion present.     Mouth/Throat:     Mouth: Mucous membranes are moist.  Eyes:     Extraocular Movements: Extraocular movements intact.     Conjunctiva/sclera: Conjunctivae normal.     Pupils: Pupils are equal, round, and reactive to light.  Cardiovascular:     Rate and Rhythm: Normal rate and regular rhythm.     Pulses: Normal pulses.     Heart sounds: Normal heart sounds.  Pulmonary:     Effort: Pulmonary effort is normal.     Breath sounds: Examination of the right-upper field reveals rhonchi. Examination of the left-upper field reveals rhonchi. Examination of the right-lower field reveals rhonchi. Examination of the left-lower field reveals rhonchi. Rhonchi present.     Comments: Post DuoNeb, increased  air movement noted throughout with decreased rhonchi.  Patient reports improvement of her breathing. Abdominal:     General: Bowel sounds are normal.     Palpations: Abdomen is soft.     Tenderness: There is no abdominal tenderness.  Musculoskeletal:     Cervical back: Normal range of motion.  Lymphadenopathy:     Cervical: No cervical adenopathy.  Skin:    General: Skin is warm and dry.  Neurological:     General: No focal deficit present.     Mental Status: She is alert and oriented to person, place, and time.  Psychiatric:        Mood and Affect: Mood normal.        Behavior: Behavior normal.      UC Treatments / Results  Labs (all labs ordered are listed, but only abnormal results are displayed) Labs Reviewed - No data to display  EKG   Radiology No results found.  Procedures Procedures (including critical care time)  Medications Ordered in UC Medications - No data to display  Initial Impression / Assessment and Plan / UC Course  I have reviewed the triage vital signs and the nursing notes.  Pertinent labs & imaging results that were available during my care of the patient were reviewed by me and considered in my medical decision making (see chart for details).  Patient with good improvement post DuoNeb and Solu-Medrol 125 mg IM.  Persistent cough present with rhonchi.  Will cover for lower respiratory infection with Augmentin 875/125 mg for the next 7 days.  Will provide another course of prednisone 40 mg for the next 5 days, and Promethazine DM for cough.  Supportive care recommendations were provided and discussed with the patient to include fluids, rest, over-the-counter analgesics, and use of a humidifier during sleep.  Discussed indications with patient regarding follow-up.  Patient was in agreement with this plan of care and verbalized understanding.  All questions were answered.  Patient stable for discharge.  Final Clinical Impressions(s) / UC Diagnoses    Final diagnoses:  None   Discharge Instructions   None    ED Prescriptions   None    PDMP not reviewed this encounter.   Abran Cantor, NP 09/20/23 1455

## 2023-09-20 NOTE — Discharge Instructions (Addendum)
 You were given a DuoNeb and Solu-Medrol 125 mg today.  Begin the prednisone on 09/21/2023. Take medication as prescribed.  Continue use of your albuterol inhaler as needed for wheezing or shortness of breath. Increase fluids and allow for plenty of rest. Recommend using a humidifier in your bedroom at nighttime during sleep and sleeping elevated on pillows while symptoms persist. May take over-the-counter Tylenol as needed for pain, fever, or general discomfort. As discussed, the cough can last from days to weeks.  Make sure you are drinking plenty of fluids, using cough drops, and over-the-counter medication as needed. Please follow-up with your primary care physician within the next 7 to 10 days for reevaluation. Follow-up as needed.
